# Patient Record
Sex: Female | Born: 1965
Health system: Southern US, Community
[De-identification: ages and names within clinical notes are randomized; demographics above are authoritative.]

## PROBLEM LIST (undated history)

## (undated) DIAGNOSIS — K219 Gastro-esophageal reflux disease without esophagitis: Secondary | ICD-10-CM

## (undated) DIAGNOSIS — D649 Anemia, unspecified: Secondary | ICD-10-CM

## (undated) DIAGNOSIS — M5126 Other intervertebral disc displacement, lumbar region: Secondary | ICD-10-CM

## (undated) DIAGNOSIS — E890 Postprocedural hypothyroidism: Secondary | ICD-10-CM

## (undated) DIAGNOSIS — K589 Irritable bowel syndrome without diarrhea: Secondary | ICD-10-CM

## (undated) DIAGNOSIS — L719 Rosacea, unspecified: Secondary | ICD-10-CM

## (undated) HISTORY — DX: Anemia, unspecified: D64.9

## (undated) HISTORY — PX: TONSILLECTOMY: SUR1361

## (undated) HISTORY — DX: Gastro-esophageal reflux disease without esophagitis: K21.9

## (undated) HISTORY — DX: Postprocedural hypothyroidism: E89.0

## (undated) HISTORY — DX: Irritable bowel syndrome, unspecified: K58.9

## (undated) HISTORY — DX: Rosacea, unspecified: L71.9

---

## 1988-01-14 LAB — HM COLONOSCOPY

## 1996-01-14 HISTORY — PX: TUBAL LIGATION: SHX77

## 2000-11-23 ENCOUNTER — Encounter: Admission: RE | Admit: 2000-11-23 | Discharge: 2000-11-23 | Payer: Self-pay | Admitting: Sports Medicine

## 2006-04-30 ENCOUNTER — Ambulatory Visit (HOSPITAL_COMMUNITY): Admission: RE | Admit: 2006-04-30 | Discharge: 2006-04-30 | Payer: Self-pay | Admitting: Orthopedic Surgery

## 2008-04-13 LAB — CONVERTED CEMR LAB: Pap Smear: NEGATIVE

## 2009-05-01 ENCOUNTER — Ambulatory Visit: Payer: Self-pay | Admitting: Internal Medicine

## 2009-05-01 DIAGNOSIS — M545 Low back pain, unspecified: Secondary | ICD-10-CM | POA: Insufficient documentation

## 2009-05-01 DIAGNOSIS — L719 Rosacea, unspecified: Secondary | ICD-10-CM | POA: Insufficient documentation

## 2009-05-01 DIAGNOSIS — B351 Tinea unguium: Secondary | ICD-10-CM | POA: Insufficient documentation

## 2009-05-01 DIAGNOSIS — K219 Gastro-esophageal reflux disease without esophagitis: Secondary | ICD-10-CM | POA: Insufficient documentation

## 2009-05-01 DIAGNOSIS — H9190 Unspecified hearing loss, unspecified ear: Secondary | ICD-10-CM | POA: Insufficient documentation

## 2009-05-01 DIAGNOSIS — Z9189 Other specified personal risk factors, not elsewhere classified: Secondary | ICD-10-CM | POA: Insufficient documentation

## 2009-05-01 DIAGNOSIS — D649 Anemia, unspecified: Secondary | ICD-10-CM | POA: Insufficient documentation

## 2009-05-03 ENCOUNTER — Telehealth (INDEPENDENT_AMBULATORY_CARE_PROVIDER_SITE_OTHER): Payer: Self-pay | Admitting: *Deleted

## 2009-05-03 ENCOUNTER — Ambulatory Visit: Payer: Self-pay | Admitting: Internal Medicine

## 2009-05-03 LAB — CONVERTED CEMR LAB
ALT: 27 U/L
AST: 27 U/L
Albumin: 3.9 g/dL
Alkaline Phosphatase: 61 U/L
BUN: 9 mg/dL
Basophils Absolute: 0 K/uL
Basophils Relative: 0.3 %
Bilirubin Urine: NEGATIVE
Bilirubin, Direct: 0.1 mg/dL
CO2: 29 meq/L
Calcium: 9 mg/dL
Chloride: 105 meq/L
Cholesterol: 161 mg/dL
Creatinine, Ser: 0.6 mg/dL
Eosinophils Absolute: 0.2 K/uL
Eosinophils Relative: 4.9 %
GFR calc non Af Amer: 115.2 mL/min
Glucose, Bld: 88 mg/dL
HCT: 38.9 %
HDL: 55 mg/dL
Hemoglobin, Urine: NEGATIVE
Hemoglobin: 13.2 g/dL
Ketones, ur: NEGATIVE mg/dL
LDL Cholesterol: 95 mg/dL
Leukocytes, UA: NEGATIVE
Lymphocytes Relative: 33.2 %
Lymphs Abs: 1.6 K/uL
MCHC: 34 g/dL
MCV: 92.5 fL
Monocytes Absolute: 0.3 K/uL
Monocytes Relative: 6.6 %
Neutro Abs: 2.6 K/uL
Neutrophils Relative %: 55 %
Nitrite: NEGATIVE
Platelets: 325 K/uL
Potassium: 4.3 meq/L
RBC: 4.2 M/uL
RDW: 13.9 %
Sodium: 140 meq/L
Specific Gravity, Urine: 1.02
TSH: 0.11 u[IU]/mL — ABNORMAL LOW
Total Bilirubin: 0.5 mg/dL
Total CHOL/HDL Ratio: 3
Total Protein, Urine: NEGATIVE mg/dL
Total Protein: 6.8 g/dL
Triglycerides: 57 mg/dL
Urine Glucose: NEGATIVE mg/dL
Urobilinogen, UA: 0.2
VLDL: 11.4 mg/dL
WBC: 4.7 10*3/microliter
pH: 7

## 2009-05-08 ENCOUNTER — Ambulatory Visit: Payer: Self-pay | Admitting: Endocrinology

## 2009-06-01 ENCOUNTER — Ambulatory Visit: Payer: Self-pay | Admitting: Endocrinology

## 2009-06-19 ENCOUNTER — Encounter (HOSPITAL_COMMUNITY): Admission: RE | Admit: 2009-06-19 | Discharge: 2009-09-17 | Payer: Self-pay | Admitting: Endocrinology

## 2009-06-22 ENCOUNTER — Ambulatory Visit: Payer: Self-pay | Admitting: Endocrinology

## 2009-06-24 LAB — CONVERTED CEMR LAB: TSH: 0.13 microintl units/mL — ABNORMAL LOW (ref 0.35–5.50)

## 2009-06-25 ENCOUNTER — Telehealth: Payer: Self-pay | Admitting: Endocrinology

## 2009-06-29 ENCOUNTER — Ambulatory Visit: Payer: Self-pay | Admitting: Endocrinology

## 2009-07-13 ENCOUNTER — Ambulatory Visit (HOSPITAL_COMMUNITY): Admission: RE | Admit: 2009-07-13 | Discharge: 2009-07-13 | Payer: Self-pay | Admitting: Endocrinology

## 2009-07-13 DIAGNOSIS — E890 Postprocedural hypothyroidism: Secondary | ICD-10-CM

## 2009-07-13 HISTORY — DX: Postprocedural hypothyroidism: E89.0

## 2009-09-06 ENCOUNTER — Ambulatory Visit: Payer: Self-pay | Admitting: Endocrinology

## 2009-09-06 DIAGNOSIS — E890 Postprocedural hypothyroidism: Secondary | ICD-10-CM | POA: Insufficient documentation

## 2009-09-06 LAB — CONVERTED CEMR LAB
Free T4: 0.41 ng/dL — ABNORMAL LOW (ref 0.60–1.60)
TSH: 37.69 microintl units/mL — ABNORMAL HIGH (ref 0.35–5.50)

## 2009-10-24 ENCOUNTER — Ambulatory Visit: Payer: Self-pay | Admitting: Endocrinology

## 2009-10-24 LAB — CONVERTED CEMR LAB: TSH: 4.21 microintl units/mL (ref 0.35–5.50)

## 2010-01-17 ENCOUNTER — Other Ambulatory Visit: Payer: Self-pay | Admitting: Endocrinology

## 2010-01-17 ENCOUNTER — Ambulatory Visit
Admission: RE | Admit: 2010-01-17 | Discharge: 2010-01-17 | Payer: Self-pay | Source: Home / Self Care | Attending: Endocrinology | Admitting: Endocrinology

## 2010-01-17 DIAGNOSIS — I959 Hypotension, unspecified: Secondary | ICD-10-CM | POA: Insufficient documentation

## 2010-01-17 LAB — CORTISOL: Cortisol, Plasma: 9 ug/dL

## 2010-01-17 LAB — TSH: TSH: 1.25 u[IU]/mL (ref 0.35–5.50)

## 2010-01-21 ENCOUNTER — Telehealth: Payer: Self-pay | Admitting: Endocrinology

## 2010-02-03 ENCOUNTER — Encounter: Payer: Self-pay | Admitting: Orthopedic Surgery

## 2010-02-12 NOTE — Assessment & Plan Note (Signed)
Summary: NEW [employer] PT--#--PKG/OFF--STC   Vital Signs:  Patient profile:   45 year old female Height:      67 inches (170.18 cm) Weight:      155.4 pounds (70.64 kg) BMI:     24.43 O2 Sat:      97 % on Room air Temp:     97.9 degrees F (36.61 degrees C) oral Pulse rate:   76 / minute BP sitting:   100 / 68  (left arm) Cuff size:   regular  Vitals Entered By: Orlan Leavens (May 01, 2009 3:16 PM)  O2 Flow:  Room air CC: New patient/CPX Is Patient Diabetic? No Pain Assessment Patient in pain? no        Primary Care Provider:  Newt Lukes MD  CC:  New patient/CPX.  History of Present Illness: new pt to me and our practice, here to est care - patient is here today for annual physical. Patient feels well -  she is not fasting but will return in AM for labs has annual PAP with gyn, due for mammo  wants to review several concerns 1) redness of cheeks - no itch or burn, no recent sunexposure - onset>6 mos ago -  symptoms worse in cold weather and after alcohol or spicy food tried changing face soap - no better - no other skin areas affected  2) chronic diarrhea - present "all my life"  had colo 1990 in [place] (home country) for same & "nothing found" describes as soft-liquid - has 3-6 BM every day - rare episode of constipation "when i am stressed or traveling" - no fever, no weight loss, no abd pain, no BRBPR or melena ?remote hx Giardia  3) concern about toenail fungus -  mostly on on right 3/4 toes present >12 mos also trauma to left great toe in past 77mo -  Preventive Screening-Counseling & Management  Alcohol-Tobacco     Alcohol drinks/day: <1     Alcohol Counseling: not indicated; use of alcohol is not excessive or problematic     Smoking Status: quit     Year Quit: 2005     Tobacco Counseling: not to resume use of tobacco products  Caffeine-Diet-Exercise     Diet Counseling: not indicated; diet is assessed to be healthy     Nutrition  Referrals: no     Does Patient Exercise: no     Exercise Counseling: to improve exercise regimen     Depression Counseling: not indicated; screening negative for depression  Safety-Violence-Falls     Seat Belt Use: yes     Helmet Use: n/a     Firearms in the Home: no firearms in the home     Smoke Detectors: yes     Violence in the Home: no risk noted     Fall Risk Counseling: not indicated; no significant falls noted  Clinical Review Panels:  Prevention   Last Mammogram:  No specific mammographic evidence of malignancy.   (04/13/2008)   Last Pap Smear:  Interpretation/Result:Negative for intraepithelial Lesion or Malignancy.    (04/13/2008)   Last Colonoscopy:  Pt states was done back in her home country due to ongoing diarrhea results came back normal (01/14/1988)  Immunizations   Last Tetanus Booster:  Historical (01/14/2004)   Current Medications (verified): 1)  None  Allergies (verified): 1)  ! Penicillin  Past History:  Past Medical History: Anemia-NOS GERD ?IBS LBP, right herniated disc L3-4, 4-5 12/2005 MRI  MD rooster[facility]  Past Surgical History: Tonsillectomy (1976) Tubal ligation (1998)  Family History: Family History Breast cancer 1st degree relative <50 (aunt, cousin) Family History of Colon CA 1st degree relative <60 (uncle on father side) Family History High cholesterol (dad) Emotional illness (mother) Dementia (grandmother) cancer of tongue (other relative)  Social History: Former Smoker married, lives with spouse since 2010 2 grown kids in college moved to [place] in 2009 from [place] to learn [place] -  works as Airline pilot and going to school at [employer] Smoking Status:  quit Does Patient Exercise:  no Risk analyst Use:  yes  Review of Systems       see HPI above. I have reviewed all other systems and they were negative.   Physical Exam  General:  alert, well-developed, well-nourished,  and cooperative to examination.  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  conjunctiva and lids normal.    Ears:  chronic decreased hearing L>>R (since age 68y) - lost hearing aide -normal pinnae bilaterally, without erythema, swelling, or tenderness to palpation. TMs clear, without effusion, or cerumen impaction Nose:  External nasal examination shows no deformity or inflammation. Nasal mucosa are pink and moist without lesions or exudates. Mouth:  teeth and gums in good repair; mucous membranes moist, without lesions or ulcers. oropharynx clear without exudate, no erythema.  Neck:  supple, full ROM, no masses, no thyromegaly; no thyroid nodules or tenderness. no JVD or carotid bruits.   Lungs:  normal respiratory effort, no intercostal retractions or use of accessory muscles; normal breath sounds bilaterally - no crackles and no wheezes.    Heart:  normal rate, regular rhythm, no murmur, and no rub. BLE without edema. normal DP pulses and normal cap refill in all 4 extremities    Abdomen:  soft, non-tender, normal bowel sounds, no distention; no masses and no appreciable hepatomegaly or splenomegaly.   Genitalia:  defer to gyn Msk:  No deformity or scoliosis noted of thoracic or lumbar spine.  back: full range of motion of lumbar spine. Nontender to palpation. Negative straight leg raise. Deep tendon reflexes symmetrically intact at Achilles and patella, negative clonus. Sensation intact throughout all dermatomes in bilateral lower extremities. Full strength to manual muscle testing in all major muscule groups including EHL, anterior tibialis, gastrocnemius, quadriceps, and iliopsoas. Able to heel and toe walk without difficulty and ambulates with a normal gait.  Neurologic:  alert & oriented X3 and cranial nerves II-XII symetrically intact.  strength  normal in all extremities, sensation intact to light touch, and gait normal. speech fluent without dysarthria or aphasia; follows commands with good comprehension.  Skin:  onchomycosis right 3 & 4 toenails, separation of great toenail on L due to prior trauma; +facial rosecea R>L face over cheeks Psych:  Oriented X3, memory intact for recent and remote, normally interactive, good eye contact, not anxious appearing, not depressed appearing, and not agitated.      Impression & Recommendations:  Problem # 1:  PREVENTIVE HEALTH CARE (ICD-V70.0) Patient has been counseled on age-appropriate routine health concerns for screening and prevention. These are reviewed and up-to-date. Immunizations are up-to-date or declined. Labs ordered for fasting in AM and will be reviewed; ECG declines; PAP per gyn  Problem # 2:  ONYCHOMYCOSIS, BILATERAL (ICD-110.1)  Her updated medication list for this problem includes:  Penlac 8 % Soln (Ciclopirox) .[name]... Apply to nail(s) at bedtime  Discussed nail care and medication treatment options.   Orders: Prescription Created Electronically [phone])  Problem # 3:  ROSACEA (ICD-695.3)  metrogel - erx done  Orders: Prescription Created Electronically [id number])  Problem # 4:  DIARRHEA (ICD-787.91)  ?IBS by hx vs other -  no screening labs done yet to look for anemia but ?malabs or celiac sprue refer to GI for new eval as not looked over since 1990 (>19y) no concerning symptoms on hx such as fever, blood, pain or weight loss Orders: Gastroenterology Referral (GI)  Discussed symptom control and diet. Call if worsening of symptoms priot to GI eval  Problem # 5:  LOW BACK PAIN, CHRONIC (ICD-724.2) related to ruptured disc 12/207 - copy of spanish MRI report reviewed today - right side L3-4 and 4-5 affected - s/p steroid injection prev at ortho/pain mgmt - pt states symptoms bearable at this time but will let us knw if this changes  Problem # 6:  HEARING LOSS  (ICD-389.9)  chronic issue L>R - refer to audiology as needs new hearing aid and new eval  Orders: Audiology (Audio)  Complete Medication List: 1)  Penlac 8 % Soln (Ciclopirox) .... Apply to nail(s) at bedtime 2)  Metrogel 1 % Gel (Metronidazole) .... Apply to affected face skin once daily 3)  Prevacid 24hr 15 Mg Cpdr (Lansoprazole) .[name].. 1 by mouth once daily as needed for heartburn symptoms  Patient Instructions: 1)  it was good to see you today. 2)  for your toenail fungus, use PenLac nail polish - 3)  for your rosecea, use MetroGel 4)  your prescriptions have been electronically submitted to your pharmacy. Please take as directed. Contact our office if you believe you're having problems with the medication(s).  5)  return to this office for labs only one morning this week before you have anything to eat (fasting) - physical bloodwork/labs ordered today - your results will be posted on the phone tree for review in 48-72 hours from the time of test completion; call [phone] and enter your 9 digit MRN (listed above on this page, just below your name); if any changes need to be made or there are abnormal results, you will be contacted directly.  6)  Please schedule a follow-up appointment annually for medical physical and labs, call sooner if problems.  7)  we'll make referral to [facility] for your diarrhea. Also for hearing tests at [facility]. Our office will contact you regarding these appointments once made.  8)  Schedule your mammogram. 9)  You should have regular Pap Smear to prevent cervical cancer. Let us know if you need a gynecology referral for this Prescriptions: METROGEL 1 % GEL (METRONIDAZOLE) apply to affected face skin once daily  #1 x 5   Entered and Authorized by:   Newt Lukes MD   Signed by:   Newt Lukes MD on 05/01/2009   Method used:   Electronically to        [facility] (retail)       [address] [place], [place]  [address]       Ph: [phone]       Fax: [phone]   RxID:   [id number] PENLAC 8 % SOLN (CICLOPIROX) apply to nail(s) at bedtime  #1 x 6   Entered and Authorized by:  Newt Lukes MD   Signed by:   Newt Lukes MD on 05/01/2009   Method used:   Electronically to        [facility] (retail)       [address] [place], [place]  [address]       Ph: [phone]       Fax: [phone]   RxID:   [id number]    Mammogram  Procedure date:  04/13/2008  Findings:      No specific mammographic evidence of malignancy.    Pap Smear  Procedure date:  04/13/2008  Findings:      Interpretation/Result:Negative for intraepithelial Lesion or Malignancy.     Colonoscopy  Procedure date:  01/14/1988  Findings:      Pt states was done back in her home country due to ongoing diarrhea results came back normal    Immunization History:  Tetanus/Td Immunization History:    Tetanus/Td:  historical (01/14/2004)

## 2010-02-12 NOTE — Assessment & Plan Note (Signed)
Summary: f/u appt/cd   Vital Signs:  Patient profile:   45 year old female Height:      67 inches (170.18 cm) Weight:      163.25 pounds (74.20 kg) BMI:     25.66 O2 Sat:      98 % on Room air Temp:     97.9 degrees F (36.61 degrees C) oral Pulse rate:   67 / minute BP sitting:   92 / 62  (left arm) Cuff size:   large  Vitals Entered By: Brenton Grills MA (September 06, 2009 10:45 AM)  O2 Flow:  Room air CC: F/U appt/refill on Pelnac/aj   Primary Provider:  Newt Lukes MD  CC:  F/U appt/refill on Pelnac/aj.  History of Present Illness: pt is now 8 weeks s/p i-131 rx for hyperthyroidism.  since then, she has weight gain and cold intolerance.    Current Medications (verified): 1)  Penlac 8 % Soln (Ciclopirox) .... Apply To Nail(S) At Bedtime 2)  Prevacid 24hr 15 Mg Cpdr (Lansoprazole) .[name].. 1 By Mouth Once Daily As Needed For Heartburn Symptoms 3)  Metronidazole 0.75 % Crea (Metronidazole) .... Apply To Face  Allergies (verified): 1)  ! Penicillin  Past History:  Past Medical History: Last updated: 05/01/2009 Anemia-NOS GERD ?IBS LBP, right herniated disc L3-4, 4-5 12/2005 MRI  MD rooster: [facility]  Review of Systems       diarrhea and insomnia have resolved  Physical Exam  General:  normal appearance.   Neck:  thyroid is non-palpable Additional Exam:  FastTSH              [H]  37.69 uIU/mL                0.35-5.50 Free T4              [L]  0.41 ng/dL      Impression & Recommendations:  Problem # 1:  OTHER POSTABLATIVE HYPOTHYROIDISM (ICD-244.1) needs increased rx  Other Orders: TLB-TSH (Thyroid Stimulating Hormone) (84443-TSH) TLB-T4 (Thyrox), Free [phone]) Est. Patient Level III (19147)  Patient Instructions: 1)  blood tests are being ordered for you today.  please call [phone] to hear your test results. 2)  here are some samples of "synthroid" 112 micrograms/day.  on the phone-message, i'll advise you  if you need to take. 3)  Please schedule a follow-up appointment in 1 month. 4)  (update: i left message on phone-tree:  start the synthroid as we discussed).

## 2010-02-12 NOTE — Progress Notes (Signed)
Summary: I-131?  Phone Note Call from Patient Call back at Work Phone [phone]   Caller: Patient Summary of Call: pt called requesting to know if she decides to take I-131 therapy, how much time does MD think she will need to take off work total? Initial call taken by: Margaret Pyle, CMA,  June 25, 2009 10:08 AM  Follow-up for Phone Call        pt advised 3-5 days. Pt had several follow up questions that I was unable to answer. I advised pt to sch f/u appt with SAE to discuss pros and cons of procedure. Pt transferred to sch Follow-up by: Margaret Pyle, CMA,  June 28, 2009 11:24 AM

## 2010-02-12 NOTE — Assessment & Plan Note (Signed)
Summary: new endo/leschber/thyroid/cd   Vital Signs:  Patient profile:   45 year old female Height:      67 inches (170.18 cm) Weight:      155.25 pounds (70.57 kg) O2 Sat:      98 % on Room air Temp:     97.0 degrees F (36.11 degrees C) oral Pulse rate:   80 / minute BP sitting:   108 / 68  (left arm) Cuff size:   regular  Vitals Entered By: Josph Macho RMA (May 08, 2009 3:40 PM)  O2 Flow:  Room air CC: New endo: Thyroid/ CF   Primary Provider:  Newt Lukes MD  CC:  New endo: Thyroid/ CF.  History of Present Illness: pt states of 20 years of moderate bowel frequency and palpitations in the chest, and associated hair loss, dry skin, anxiety, and depression.  she is unaware of having had any previous thyroid problem.  Current Medications (verified): 1)  Penlac 8 % Soln (Ciclopirox) .... Apply To Nail(S) At Bedtime 2)  Metrogel 1 % Gel (Metronidazole) .... Apply To Affected Face Skin Once Daily 3)  Prevacid 24hr 15 Mg Cpdr (Lansoprazole) .[name].. 1 By Mouth Once Daily As Needed For Heartburn Symptoms  Allergies (verified): 1)  ! Penicillin  Past History:  Past Medical History: Last updated: 05/01/2009 Anemia-NOS GERD ?IBS LBP, right herniated disc L3-4, 4-5 12/2005 MRI  MD rooster: [facility]  Family History: Reviewed history from 05/01/2009 and no changes required. Family History Breast cancer 1st degree relative <50 (aunt, cousin) Family History of Colon CA 1st degree relative <60 (uncle on father side) Family History High cholesterol (dad) Emotional illness (mother) Dementia (grandmother) cancer of tongue (other relative) no thyroid probs  Social History: Reviewed history from 05/01/2009 and no changes required. Former Smoker married, lives with spouse since 2010 2 grown kids in college moved to [place] in 2009 from [place] to learn [place] -  works as Airline pilot and going to school at [employer]  Review  of Systems       denies weight change, headache, hoarseness, visual loss, sob, excessive diaphoresis, numbness, tremor, and rhinorrhea she reports difficulty with concentration, muscle cramps, and polyuria.  she has decreased frequency of menses recently.    Physical Exam  General:  normal appearance.   Head:  head: no deformity eyes: no periorbital swelling, no proptosis external nose and ears are normal mouth: no lesion seen Neck:  thyroid is normal on the right, and slightly enlarged on the left.  no nodule Lungs:  Clear to auscultation bilaterally. Normal respiratory effort.  Heart:  Regular rate and rhythm without murmurs or gallops noted. Normal S1,S2.   Msk:  muscle bulk and strength are grossly normal.  no obvious joint swelling.  gait is normal and steady  Extremities:  no deformity no edema Neurologic:  cn 2-12 grossly intact.   readily moves all 4's.   sensation is intact to touch on the feet  Skin:  normal texture and temp.  no rash.  not diaphoretic  Cervical Nodes:  No significant adenopathy.  Psych:  Alert and cooperative; normal mood and affect; normal attention span and concentration.   Additional Exam:   FastTSH              [L]  0.11 uIU/mL    Impression & Recommendations:  Problem # 1:  THYROID STIMULATING HORMONE, ABNORMAL (ICD-246.9) based on the texture of her thyroid, is prob due to  grave's dz  Problem # 2:  palpitations prob due to #1  Problem # 3:  anxiety possibly due to #1  Medications Added to Medication List This Visit: 1)  Metronidazole 0.75 % Crea (Metronidazole) .... Apply to face  Other Orders: Consultation Level IV (81191)  Patient Instructions: 1)  go to lab in approx 2 weeks:  tsh and free t4 242.9. 2)  we discussed the causes, risks, and treatment options of hyperthyroidism. Prescriptions: METRONIDAZOLE 0.75 % CREA (METRONIDAZOLE) apply to face  #45 grams x 6   Entered and Authorized by:   Minus Breeding MD   Signed by:   Minus Breeding MD on 05/08/2009   Method used:   Electronically to        [phone]* (retail)       [address] [place], [place]  [address]       Ph: [phone]       Fax: [phone]   RxID:   [id number]

## 2010-02-12 NOTE — Assessment & Plan Note (Signed)
Summary: F/U APPT/#/CD   Vital Signs:  Patient profile:   45 year old female Height:      67 inches (170.18 cm) Weight:      159.38 pounds (72.45 kg) O2 Sat:      98 % on Room air Temp:     98.1 degrees F (36.72 degrees C) oral Pulse rate:   85 / minute BP sitting:   98 / 80  (left arm) Cuff size:   regular  Vitals Entered By: Josph Macho RMA (June 29, 2009 4:14 PM)  O2 Flow:  Room air CC: Follow-up visit/ CF Is Patient Diabetic? No   Primary Provider:  Newt Lukes MD  CC:  Follow-up visit/ CF.  History of Present Illness: pt has monthly but light menses.  pt states she feels well in general.   Current Medications (verified): 1)  Penlac 8 % Soln (Ciclopirox) .... Apply To Nail(S) At Bedtime 2)  Prevacid 24hr 15 Mg Cpdr (Lansoprazole) .[name].. 1 By Mouth Once Daily As Needed For Heartburn Symptoms 3)  Metronidazole 0.75 % Crea (Metronidazole) .... Apply To Face  Allergies (verified): 1)  ! Penicillin  Past History:  Past Medical History: Last updated: 05/01/2009 Anemia-NOS GERD ?IBS LBP, right herniated disc L3-4, 4-5 12/2005 MRI  MD rooster: [facility]  Review of Systems  The patient denies fever.    Physical Exam  General:  normal appearance.   Neck:  thyroid is normal on the right, and slightly enlarged on the left.  no nodule   Impression & Recommendations:  Problem # 1:  HYPERTHYROIDISM (ICD-242.90) Assessment Improved we discussed rx options.  after discussion, pt chooses i-131 rx  Other Orders: Radiology Referral (Radiology) Est. Patient Level III ([address])  Patient Instructions: 1)  please do radioactive iodine therapy.  you will be called with a day and time for an appointment 2)  please return here 6-8 weeks later.

## 2010-02-12 NOTE — Progress Notes (Signed)
----   Converted from flag ---- ---- 05/03/2009 3:14 PM, Verdell Face wrote: appt made for 5/6 w/Dr Everardo All.  ---- 05/03/2009 2:07 PM, Dagoberto Reef wrote: Thanks  ---- 05/03/2009 1:47 PM, Newt Lukes MD wrote: The following orders have been entered for this patient and placed on Admin Hold:  Type:     Referral       Code:   Endocrine Description:   Endocrinology Referral Order Date:   05/03/2009   Authorized By:   Newt Lukes MD Order #:   [id number] Clinical Notes:   ellsion - abn TSH -eval and tx ------------------------------

## 2010-02-12 NOTE — Assessment & Plan Note (Signed)
Summary: F/U APPT/#/CD   Vital Signs:  Patient profile:   45 year old female Height:      67 inches (170.18 cm) Weight:      166.38 pounds (75.63 kg) BMI:     26.15 O2 Sat:      99 % on Room air Temp:     98.0 degrees F (36.67 degrees C) oral Pulse rate:   86 / minute BP sitting:   120 / 78  (left arm) Cuff size:   large  Vitals Entered By: Brenton Grills MA (October 24, 2009 8:53 AM)  O2 Flow:  Room air CC: Follow-up visit/aj Is Patient Diabetic? No   Primary Provider:  Newt Lukes MD  CC:  Follow-up visit/aj.  History of Present Illness: pt is now 3 1/2 months s/p i-131 rx for hyperthyroidism.  she feels better in general on the synthroid.    Current Medications (verified): 1)  Penlac 8 % Soln (Ciclopirox) .... Apply To Nail(S) At Bedtime 2)  Prevacid 24hr 15 Mg Cpdr (Lansoprazole) .[name].. 1 By Mouth Once Daily As Needed For Heartburn Symptoms 3)  Metronidazole 0.75 % Crea (Metronidazole) .... Apply To Face  Allergies (verified): 1)  ! Penicillin  Past History:  Past Medical History: Last updated: 05/01/2009 Anemia-NOS GERD ?IBS LBP, right herniated disc L3-4, 4-5 12/2005 MRI  MD rooster: [facility]  Review of Systems  The patient denies weight loss and weight gain.    Physical Exam  General:  normal appearance.   Neck:  thyroid is non-palpable. Additional Exam:   FastTSH                   4.21 uIU/mL      Impression & Recommendations:  Problem # 1:  OTHER POSTABLATIVE HYPOTHYROIDISM (ICD-244.1) tsh is normal, but increased need for synthroid is anticipated at this point after i-131 rx  Other Orders: TLB-TSH (Thyroid Stimulating Hormone) (84443-TSH) Est. Patient Level III ([address])  Patient Instructions: 1)  blood tests are being ordered for you today.  please call [phone] to hear your test results. 2)  Please schedule a follow-up appointment in 1 month. 3)  here are some samples of "synthroid" 150  micrograms. 4)  the messsage will tell you if this is the correct new strength. 5)  (update: i left message on phone-tree:  take the synthroid 150/day.  ret as scheduled).

## 2010-02-14 NOTE — Assessment & Plan Note (Signed)
Summary: rx refill-lb   Vital Signs:  Patient profile:   45 year old female Height:      67 inches (170.18 cm) Weight:      170.38 pounds (77.45 kg) BMI:     26.78 O2 Sat:      98 % on Room air Temp:     98.9 degrees F (37.17 degrees C) oral Pulse rate:   96 / minute Pulse rhythm:   regular BP sitting:   90 / 62  (left arm) Cuff size:   large  Vitals Entered By: Brenton Grills CMA (AAMA) (January 17, 2010 8:04 AM)  O2 Flow:  Room air CC: Follow-up visit/aj Is Patient Diabetic? No   Primary Provider:  Newt Lukes MD  CC:  Follow-up visit/aj.  History of Present Illness: pt is now 6 months s/p i-131 rx for hyperthyroidism.  she feels better in general on the synthroid 150/day.  in particular, she reports less hair loss.     Current Medications (verified): 1)  Penlac 8 % Soln (Ciclopirox) .... Apply To Nail(S) At Bedtime 2)  Prevacid 24hr 15 Mg Cpdr (Lansoprazole) .[name].. 1 By Mouth Once Daily As Needed For Heartburn Symptoms 3)  Metronidazole 0.75 % Crea (Metronidazole) .... Apply To Face  Allergies (verified): 1)  ! Penicillin  Past History:  Past Medical History: Last updated: 05/01/2009 Anemia-NOS GERD ?IBS LBP, right herniated disc L3-4, 4-5 12/2005 MRI  MD rooster: g[facility] - [facility]  Review of Systems  The patient denies syncope.         she denies dizziness  Physical Exam  General:  normal appearance.   Neck:  thyroid is non-palpable. Additional Exam:  FastTSH                   1.25 uIU/mL                 0.35-5.50 Cortisol                  9.0 ug/dL   Impression & Recommendations:  Problem # 1:  OTHER POSTABLATIVE HYPOTHYROIDISM (ICD-244.1) well-replaced  Problem # 2:  HYPOTENSION (ICD-458.9) asymptomatic this random cortisol does not determine intact adrenal function pt declines acth stim test  Medications Added to Medication List This Visit: 1)  Levothyroxine Sodium 150 Mcg Tabs (Levothyroxine sodium)  .[name].. 1 tab once daily  Other Orders: TLB-TSH (Thyroid Stimulating Hormone) (84443-TSH) TLB-Cortisol (82533-CORT) Est. Patient Level III (04540)  Patient Instructions: 1)  blood tests are being ordered for you today.  please call [phone] to hear your test results. 2)  pending the test results, please continue the same medications for now 3)  in 3 months, go to lab for tsh 244.1 4)  return here as needed. 5)  (update: i left message on phone-tree:  call if you want acth test)   Orders Added: 1)  TLB-TSH (Thyroid Stimulating Hormone) [84443-TSH] 2)  TLB-Cortisol [82533-CORT] 3)  Est. Patient Level III [78295]

## 2010-02-14 NOTE — Progress Notes (Signed)
Summary: Rx refill req  Phone Note Refill Request Message from:  Patient on January 21, 2010 11:25 AM  Refills Requested: Medication #1:  LEVOTHYROXINE SODIUM 150 MCG TABS 1 tab once daily.   Dosage confirmed as above?Dosage Confirmed  Method Requested: Electronic Initial call taken by: Margaret Pyle, CMA,  January 21, 2010 11:25 AM    Prescriptions: LEVOTHYROXINE SODIUM 150 MCG TABS (LEVOTHYROXINE SODIUM) 1 tab once daily  #90 x 1   Entered by:   Margaret Pyle, CMA   Authorized by:   Minus Breeding MD   Signed by:   Margaret Pyle, CMA on 01/21/2010   Method used:   Electronically to        [facility] [id number] (retail)       [address] [place], [place]  [address]       Ph: [phone]       Fax: [phone]   RxID:   [id number]

## 2010-04-22 ENCOUNTER — Other Ambulatory Visit: Payer: Self-pay | Admitting: Internal Medicine

## 2010-04-22 DIAGNOSIS — E890 Postprocedural hypothyroidism: Secondary | ICD-10-CM

## 2010-04-23 ENCOUNTER — Other Ambulatory Visit: Payer: Self-pay

## 2010-06-04 ENCOUNTER — Encounter: Payer: Self-pay | Admitting: Internal Medicine

## 2010-06-05 ENCOUNTER — Encounter: Payer: Self-pay | Admitting: Internal Medicine

## 2010-06-05 ENCOUNTER — Other Ambulatory Visit (INDEPENDENT_AMBULATORY_CARE_PROVIDER_SITE_OTHER): Payer: BC Managed Care – PPO

## 2010-06-05 ENCOUNTER — Ambulatory Visit (INDEPENDENT_AMBULATORY_CARE_PROVIDER_SITE_OTHER): Payer: BC Managed Care – PPO | Admitting: Internal Medicine

## 2010-06-05 VITALS — BP 92/60 | HR 98 | Temp 98.2°F | Ht 67.0 in | Wt 165.8 lb

## 2010-06-05 DIAGNOSIS — Z0000 Encounter for general adult medical examination without abnormal findings: Secondary | ICD-10-CM

## 2010-06-05 DIAGNOSIS — E890 Postprocedural hypothyroidism: Secondary | ICD-10-CM

## 2010-06-05 LAB — HEPATIC FUNCTION PANEL
Albumin: 3.8 g/dL (ref 3.5–5.2)
Bilirubin, Direct: 0.1 mg/dL (ref 0.0–0.3)
Total Protein: 6.6 g/dL (ref 6.0–8.3)

## 2010-06-05 LAB — URINALYSIS
Ketones, ur: NEGATIVE
Leukocytes, UA: NEGATIVE
Specific Gravity, Urine: 1.015 (ref 1.000–1.030)
pH: 8 (ref 5.0–8.0)

## 2010-06-05 LAB — CBC WITH DIFFERENTIAL/PLATELET
Basophils Absolute: 0 10*3/uL (ref 0.0–0.1)
Basophils Relative: 0.5 % (ref 0.0–3.0)
Eosinophils Relative: 3.1 % (ref 0.0–5.0)
Hemoglobin: 12.5 g/dL (ref 12.0–15.0)
Lymphocytes Relative: 30.1 % (ref 12.0–46.0)
Monocytes Relative: 6.8 % (ref 3.0–12.0)
Neutro Abs: 3.3 10*3/uL (ref 1.4–7.7)
RBC: 4.15 Mil/uL (ref 3.87–5.11)
WBC: 5.5 10*3/uL (ref 4.5–10.5)

## 2010-06-05 LAB — BASIC METABOLIC PANEL
BUN: 13 mg/dL (ref 6–23)
Chloride: 107 mEq/L (ref 96–112)
Potassium: 4.9 mEq/L (ref 3.5–5.1)

## 2010-06-05 LAB — LIPID PANEL
Cholesterol: 152 mg/dL (ref 0–200)
LDL Cholesterol: 87 mg/dL (ref 0–99)
Triglycerides: 61 mg/dL (ref 0.0–149.0)
VLDL: 12.2 mg/dL (ref 0.0–40.0)

## 2010-06-05 LAB — TSH: TSH: 0.04 u[IU]/mL — ABNORMAL LOW (ref 0.35–5.50)

## 2010-06-05 MED ORDER — LEVOTHYROXINE SODIUM 137 MCG PO TABS: 137.0000 ug | ORAL_TABLET | Freq: Every day | ORAL | Status: DC

## 2010-06-05 NOTE — Progress Notes (Signed)
Subjective:    Patient ID: Kathryn Gregory, female    DOB: 06/04/1965, 45 y.o.   MRN: [id number]  HPI  patient is here today for annual physical. Patient feels well and has no complaints.  Past Medical History  Diagnosis Date  . Rosacea   . IBS (irritable bowel syndrome)   . ANEMIA-NOS   . GERD   . Other postablative hypothyroidism     I-131ablation 07/2009 for hyperthroid   Family History  Problem Relation Age of Onset  . Mental illness Mother   . Breast cancer Maternal Aunt   . Colon cancer Paternal Uncle   . Dementia Maternal Grandmother   . Breast cancer Cousin   . Cancer Other     cancer of tongue   History  Substance Use Topics  . Smoking status: Former Games developer  . Smokeless tobacco: Not on file   Comment: Married, lives with spouse since 2010. 2 grown kids in [place] in 2009 feom [place] to learn english  . Alcohol Use: No     Review of Systems  Constitutional: Negative for fever.  Respiratory: Negative for cough and shortness of breath.   Cardiovascular: Negative for chest pain.  Gastrointestinal: Negative for abdominal pain.  Musculoskeletal: Negative for gait problem.  Skin: Negative for rash.  Neurological: Negative for dizziness.  No other specific complaints in a complete review of systems (except as listed in HPI above).     Objective:   Physical Exam BP 92/60  Pulse 98  Temp(Src) 98.2 F (36.8 C) (Oral)  Ht 5\' 7"  (1.702 m)  Wt 165 lb 12.8 oz (75.206 kg)  BMI 25.97 kg/m2  SpO2 98% Physical Exam  Constitutional: She is oriented to person, place, and time. She appears well-developed and well-nourished. No distress.  HENT: Head: Normocephalic and atraumatic. Ears; B TMs ok, no erythema or effusion; Nose: Nose normal.  Mouth/Throat: Oropharynx is clear and moist. No oropharyngeal exudate.  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 Pupils are equal, round, and reactive to light. No scleral  icterus.  Neck: Normal range of motion. Neck supple. No JVD present. No thyromegaly present.  Cardiovascular: Normal rate, regular rhythm and normal heart sounds.  No murmur heard. No BLE edema. Pulmonary/Chest: Effort normal and breath sounds normal. No respiratory distress. She has no wheezes.  Abdominal: Soft. Bowel sounds are normal. She exhibits no distension. There is no tenderness.  Musculoskeletal: Normal range of motion, no joint effusions. No gross deformities Neurological: She is alert and oriented to person, place, and time. No cranial nerve deficit. Coordination normal.  Skin: Skin is warm and dry. No rash noted. No erythema.  Psychiatric: She has a normal mood and affect. Her behavior is normal. Judgment and thought content normal.   Lab Results  Component Value Date   WBC 5.5 06/05/2010   HGB 12.5 06/05/2010   HCT 37.2 06/05/2010   PLT 306.0 06/05/2010   CHOL 152 06/05/2010   TRIG 61.0 06/05/2010   HDL 52.90 06/05/2010   ALT 19 06/05/2010   AST 24 06/05/2010   NA 140 06/05/2010   K 4.9 06/05/2010   CL 107 06/05/2010   CREATININE 0.7 06/05/2010   BUN 13 06/05/2010   CO2 28 06/05/2010   TSH 0.04* 06/05/2010          Assessment & Plan:  CPX - v70.0 - Patient has been counseled on age-appropriate routine health concerns for screening and prevention. These are reviewed and up-to-date.  Immunizations are up-to-date or declined. Labs and ECG reviewed: NSR, no ischemic change  Post ablation hypothyroid - appears overtx based on TSH - reduce dose sythroid to now - recheck TSH in 3 months, follow up endo if problems

## 2010-06-05 NOTE — Patient Instructions (Addendum)
It was good to see you today. Exam, labs and EKG look good (except thyroid) - keep up the good work -  Reduce dose thyroid medication today to daily - Your prescription(s) have been submitted to your pharmacy. Please take as directed and contact our office if you believe you are having problem(s) with the medication(s). Return in 3 months for lab only visit to recheck thyroid levels. Your results will be called to you after review (48-72hours after test completion). If any changes need to be made, you will be notified at that time. Schedule visit with gynecology and for mammogram as we discussed - do it this year! Please schedule followup in 6 months for thyroid check visit, call sooner if problems.

## 2010-06-05 NOTE — Assessment & Plan Note (Signed)
I-131 tx 07/2009 for hyperthyroid dz Now appears overtx based on TSH -  reduce dose sythroid to now - recheck TSH in 3 months, follow up endo if problems

## 2010-06-06 ENCOUNTER — Telehealth: Payer: Self-pay | Admitting: Endocrinology

## 2010-06-06 DIAGNOSIS — E890 Postprocedural hypothyroidism: Secondary | ICD-10-CM

## 2010-06-06 NOTE — Telephone Encounter (Signed)
please call patient: Dr Felicity Coyer has reduce the synthroid.  Go to lab in 1 month for repeat tsh.  i have ordered.

## 2010-06-06 NOTE — Telephone Encounter (Deleted)
please call patient: Reduce synthroid to 75 mcg/day.  Ret 6 weeks.  i sent rx.

## 2010-06-06 NOTE — Telephone Encounter (Signed)
Pt advised.

## 2010-07-11 ENCOUNTER — Other Ambulatory Visit (INDEPENDENT_AMBULATORY_CARE_PROVIDER_SITE_OTHER): Payer: BC Managed Care – PPO

## 2010-07-11 ENCOUNTER — Telehealth: Payer: Self-pay | Admitting: Endocrinology

## 2010-07-11 DIAGNOSIS — E890 Postprocedural hypothyroidism: Secondary | ICD-10-CM

## 2010-07-11 MED ORDER — LEVOTHYROXINE SODIUM 125 MCG PO TABS: 125.0000 ug | ORAL_TABLET | Freq: Every day | ORAL | Status: DC

## 2010-07-11 NOTE — Telephone Encounter (Signed)
i left message on phone tree Reduce synthroid to 125/d Go back to lab in 4-6 weeks for recheck

## 2010-08-16 ENCOUNTER — Other Ambulatory Visit (INDEPENDENT_AMBULATORY_CARE_PROVIDER_SITE_OTHER): Payer: BC Managed Care – PPO

## 2010-08-16 ENCOUNTER — Telehealth: Payer: Self-pay | Admitting: Endocrinology

## 2010-08-16 DIAGNOSIS — E890 Postprocedural hypothyroidism: Secondary | ICD-10-CM

## 2010-08-16 MED ORDER — LEVOTHYROXINE SODIUM 112 MCG PO TABS: 112.0000 ug | ORAL_TABLET | Freq: Every day | ORAL | Status: DC

## 2010-08-16 NOTE — Telephone Encounter (Signed)
i left message on phone tree Reduce synthroid to 112/d Recheck in 4-6 weeks

## 2010-09-24 ENCOUNTER — Other Ambulatory Visit (INDEPENDENT_AMBULATORY_CARE_PROVIDER_SITE_OTHER): Payer: BC Managed Care – PPO

## 2010-09-24 DIAGNOSIS — E890 Postprocedural hypothyroidism: Secondary | ICD-10-CM

## 2011-02-20 ENCOUNTER — Other Ambulatory Visit: Payer: Self-pay | Admitting: Endocrinology

## 2011-04-02 ENCOUNTER — Other Ambulatory Visit: Payer: Self-pay | Admitting: Endocrinology

## 2011-04-09 LAB — HM MAMMOGRAPHY: HM Mammogram: NEGATIVE

## 2011-05-29 ENCOUNTER — Telehealth: Payer: Self-pay | Admitting: *Deleted

## 2011-05-29 DIAGNOSIS — Z0000 Encounter for general adult medical examination without abnormal findings: Secondary | ICD-10-CM

## 2011-05-29 NOTE — Telephone Encounter (Signed)
Received staff msg pt made cpx 07/02/11 need labs entered in epic,,,, 05/29/11@1 :15pm/LMB

## 2011-05-29 NOTE — Telephone Encounter (Signed)
Message copied by Deatra James on Thu May 29, 2011  1:14 PM ------      Message from: COUSIN, SHARON T      Created: Thu May 29, 2011  1:09 PM      Regarding: PHY DATE:  07/02/11       THANKS

## 2011-07-01 ENCOUNTER — Other Ambulatory Visit (INDEPENDENT_AMBULATORY_CARE_PROVIDER_SITE_OTHER): Payer: BC Managed Care – PPO

## 2011-07-01 DIAGNOSIS — Z0000 Encounter for general adult medical examination without abnormal findings: Secondary | ICD-10-CM

## 2011-07-01 LAB — HEPATIC FUNCTION PANEL
ALT: 17 U/L (ref 0–35)
AST: 26 U/L (ref 0–37)
Albumin: 3.9 g/dL (ref 3.5–5.2)
Alkaline Phosphatase: 65 U/L (ref 39–117)
Total Protein: 6.9 g/dL (ref 6.0–8.3)

## 2011-07-01 LAB — URINALYSIS, ROUTINE W REFLEX MICROSCOPIC
Hgb urine dipstick: NEGATIVE
Nitrite: NEGATIVE
Specific Gravity, Urine: 1.015 (ref 1.000–1.030)
Total Protein, Urine: NEGATIVE
Urine Glucose: NEGATIVE
Urobilinogen, UA: 0.2 (ref 0.0–1.0)

## 2011-07-01 LAB — LIPID PANEL
Cholesterol: 142 mg/dL (ref 0–200)
HDL: 55.6 mg/dL (ref >39.00)
Total CHOL/HDL Ratio: 3
Triglycerides: 64 mg/dL (ref 0.0–149.0)

## 2011-07-01 LAB — BASIC METABOLIC PANEL
CO2: 27 mEq/L (ref 19–32)
GFR: 121.05 mL/min (ref >60.00)
Glucose, Bld: 86 mg/dL (ref 70–99)
Potassium: 4.2 mEq/L (ref 3.5–5.1)
Sodium: 140 mEq/L (ref 135–145)

## 2011-07-01 LAB — CBC WITH DIFFERENTIAL/PLATELET
Basophils Absolute: 0 10*3/uL (ref 0.0–0.1)
Eosinophils Relative: 3.7 % (ref 0.0–5.0)
Lymphocytes Relative: 28.8 % (ref 12.0–46.0)
Monocytes Relative: 5.9 % (ref 3.0–12.0)
Neutrophils Relative %: 61 % (ref 43.0–77.0)
Platelets: 266 10*3/uL (ref 150.0–400.0)
RDW: 14.4 % (ref 11.5–14.6)
WBC: 6.5 10*3/uL (ref 4.5–10.5)

## 2011-07-02 ENCOUNTER — Telehealth: Payer: Self-pay | Admitting: *Deleted

## 2011-07-02 ENCOUNTER — Other Ambulatory Visit: Payer: Self-pay | Admitting: Internal Medicine

## 2011-07-02 ENCOUNTER — Encounter: Payer: Self-pay | Admitting: Internal Medicine

## 2011-07-02 ENCOUNTER — Ambulatory Visit (INDEPENDENT_AMBULATORY_CARE_PROVIDER_SITE_OTHER): Payer: BC Managed Care – PPO | Admitting: Internal Medicine

## 2011-07-02 VITALS — BP 110/78 | HR 70 | Temp 97.8°F | Ht 66.5 in | Wt 153.8 lb

## 2011-07-02 DIAGNOSIS — Z0000 Encounter for general adult medical examination without abnormal findings: Secondary | ICD-10-CM

## 2011-07-02 DIAGNOSIS — E890 Postprocedural hypothyroidism: Secondary | ICD-10-CM

## 2011-07-02 DIAGNOSIS — L719 Rosacea, unspecified: Secondary | ICD-10-CM

## 2011-07-02 MED ORDER — CICLOPIROX 8 % EX SOLN: Freq: Every day | CUTANEOUS | Status: DC

## 2011-07-02 MED ORDER — LEVOTHYROXINE SODIUM 112 MCG PO TABS: 112.0000 ug | ORAL_TABLET | Freq: Every day | ORAL | Status: DC

## 2011-07-02 NOTE — Assessment & Plan Note (Signed)
No significant response to topical metronidazole Refer to derm as per pt request

## 2011-07-02 NOTE — Telephone Encounter (Signed)
Received staff msg made cpx for 07/02/12. Need labs entered in epic... 07/02/11@2 :54pm/LMB

## 2011-07-02 NOTE — Assessment & Plan Note (Signed)
I-131 tx 07/2009 for hyperthyroid dz The current medical regimen is effective;  continue present plan and medications.  follow up endo if problems Lab Results  Component Value Date   TSH 2.60 07/01/2011

## 2011-07-02 NOTE — Progress Notes (Signed)
Subjective:    Patient ID: Kathryn Gregory, female    DOB: 09-17-1965, 46 y.o.   MRN: [id number]  HPI  patient is here today for annual physical. Patient feels well and has no complaints.  Past Medical History  Diagnosis Date  . Rosacea   . IBS (irritable bowel syndrome)   . ANEMIA-NOS   . GERD   . Other postablative hypothyroidism 07/2009    I-131ablation 07/2009 for hyperthyroid   Family History  Problem Relation Age of Onset  . Mental illness Mother   . Breast cancer Maternal Aunt   . Colon cancer Paternal Uncle   . Dementia Maternal Grandmother   . Breast cancer Cousin   . Cancer Other     cancer of tongue   History  Substance Use Topics  . Smoking status: Former Games developer  . Smokeless tobacco: Not on file   Comment: Married, lives with spouse since 2010. 2 grown kids in [place] in 2009 feom [place] to learn english  . Alcohol Use: No     Review of Systems Constitutional: Negative for fever.  Respiratory: Negative for cough and shortness of breath.   Cardiovascular: Negative for chest pain or palpitations.  Gastrointestinal: Negative for abdominal pain.  Musculoskeletal: Negative for gait problem.  Skin: Negative for rash.  Neurological: Negative for dizziness.  No other specific complaints in a complete review of systems (except as listed in HPI above).     Objective:   Physical Exam  BP 110/78  Pulse 70  Temp 97.8 F (36.6 C) (Oral)  Ht 5' 6.5" (1.689 m)  Wt 153 lb 12.8 oz (69.763 kg)  BMI 24.45 kg/m2  SpO2 98% Wt Readings from Last 3 Encounters:  07/02/11 153 lb 12.8 oz (69.763 kg)  06/05/10 165 lb 12.8 oz (75.206 kg)  01/17/10 170 lb 6.1 oz (77.284 kg)   Constitutional: She appears well-developed and well-nourished. No distress.  HENT: Head: Normocephalic and atraumatic. Ears; B TMs ok, no erythema or effusion; Nose: Nose normal. Mouth/Throat: Oropharynx is clear and moist. No oropharyngeal exudate.  Chronic right lower jaw deformity from birth (forcep)  injury Eyes: Conjunctivae and EOM are normal. Pupils are equal, round, and reactive to light. No scleral icterus.  Neck: Normal range of motion. Neck supple. No JVD present. No thyromegaly present.  Cardiovascular: Normal rate, regular rhythm and normal heart sounds.  No murmur heard. No BLE edema. Pulmonary/Chest: Effort normal and breath sounds normal. No respiratory distress. She has no wheezes.  Abdominal: Soft. Bowel sounds are normal. She exhibits no distension. There is no tenderness.  Musculoskeletal: Normal range of motion, no joint effusions. No gross deformities Neurological: She is alert and oriented to person, place, and time. No cranial nerve deficit. Coordination normal.  Skin: facial rosacea - other skin is warm and dry. No rash noted. No erythema.  Psychiatric: She has a normal mood and affect. Her behavior is normal. Judgment and thought content normal.   Lab Results  Component Value Date   WBC 6.5 07/01/2011   HGB 11.9* 07/01/2011   HCT 36.5 07/01/2011   PLT 266.0 07/01/2011   CHOL 142 07/01/2011   TRIG 64.0 07/01/2011   HDL 55.60 07/01/2011   ALT 17 07/01/2011   AST 26 07/01/2011   NA 140 07/01/2011   K 4.2 07/01/2011   CL 107 07/01/2011   CREATININE 0.6 07/01/2011   BUN 11 07/01/2011   CO2 27 07/01/2011   TSH 2.60 07/01/2011   ECG: nsr @71  bpm, no ischemic changes  Assessment & Plan:  CPX - v70.0 - Patient has been counseled on age-appropriate routine health concerns for screening and prevention. These are reviewed and up-to-date. Immunizations are up-to-date or declined. Labs and ECG reviewed: NSR, no ischemic change

## 2011-07-02 NOTE — Telephone Encounter (Signed)
Message copied by Deatra James on Wed Jul 02, 2011  2:52 PM ------      Message from: COUSIN, SHARON T      Created: Wed Jul 02, 2011  2:38 PM      Regarding: PHY DATE   07/02/12       THANKS

## 2011-07-02 NOTE — Patient Instructions (Signed)
It was good to see you today. We have reviewed your prior records including labs and tests today Health Maintenance reviewed - all recommended immunizations and age-appropriate screenings are up-to-date.  Medications reviewed, no changes at this time. Refill on medication(s) as discussed today. we'll make referral to dermatologist for your skin. Our office will contact you regarding appointment(s) once made. Please schedule followup in 1 year for medical physical and labs, call sooner if problems.  Preventive Care for Adults, Female A healthy lifestyle and preventive care can promote health and wellness. Preventive health guidelines for women include the following key practices.  A routine yearly physical is a good way to check with your caregiver about your health and preventive screening. It is a chance to share any concerns and updates on your health, and to receive a thorough exam.   Visit your dentist for a routine exam and preventive care every 6 months. Brush your teeth twice a day and floss once a day. Good oral hygiene prevents tooth decay and gum disease.   The frequency of eye exams is based on your age, health, family medical history, use of contact lenses, and other factors. Follow your caregiver's recommendations for frequency of eye exams.   Eat a healthy diet. Foods like vegetables, fruits, whole grains, low-fat dairy products, and lean protein foods contain the nutrients you need without too many calories. Decrease your intake of foods high in solid fats, added sugars, and salt. Eat the right amount of calories for you. Get information about a proper diet from your caregiver, if necessary.   Regular physical exercise is one of the most important things you can do for your health. Most adults should get at least 150 minutes of moderate-intensity exercise (any activity that increases your heart rate and causes you to sweat) each week. In addition, most adults need  muscle-strengthening exercises on 2 or more days a week.   Maintain a healthy weight. The body mass index (BMI) is a screening tool to identify possible weight problems. It provides an estimate of body fat based on height and weight. Your caregiver can help determine your BMI, and can help you achieve or maintain a healthy weight. For adults 20 years and older:   A BMI below 18.5 is considered underweight.   A BMI of 18.5 to 24.9 is normal.   A BMI of 25 to 29.9 is considered overweight.   A BMI of 30 and above is considered obese.   Maintain normal blood lipids and cholesterol levels by exercising and minimizing your intake of saturated fat. Eat a balanced diet with plenty of fruit and vegetables. Blood tests for lipids and cholesterol should begin at age 78 and be repeated every 5 years. If your lipid or cholesterol levels are high, you are over 50, or you are at high risk for heart disease, you may need your cholesterol levels checked more frequently. Ongoing high lipid and cholesterol levels should be treated with medicines if diet and exercise are not effective.   If you smoke, find out from your caregiver how to quit. If you do not use tobacco, do not start.   If you are pregnant, do not drink alcohol. If you are breastfeeding, be very cautious about drinking alcohol. If you are not pregnant and choose to drink alcohol, do not exceed 1 drink per day. One drink is considered to be 12 ounces (355 mL) of beer, 5 ounces (148 mL) of wine, or 1.5 ounces (44 mL) of liquor.  Avoid use of street drugs. Do not share needles with anyone. Ask for help if you need support or instructions about stopping the use of drugs.   High blood pressure causes heart disease and increases the risk of stroke. Your blood pressure should be checked at least every 1 to 2 years. Ongoing high blood pressure should be treated with medicines if weight loss and exercise are not effective.   If you are 30 to 47 years old,  ask your caregiver if you should take aspirin to prevent strokes.   Diabetes screening involves taking a blood sample to check your fasting blood sugar level. This should be done once every 3 years, after age 33, if you are within normal weight and without risk factors for diabetes. Testing should be considered at a younger age or be carried out more frequently if you are overweight and have at least 1 risk factor for diabetes.   Breast cancer screening is essential preventive care for women. You should practice "breast self-awareness." This means understanding the normal appearance and feel of your breasts and may include breast self-examination. Any changes detected, no matter how small, should be reported to a caregiver. Women in their 25s and 30s should have a clinical breast exam (CBE) by a caregiver as part of a regular health exam every 1 to 3 years. After age 54, women should have a CBE every year. Starting at age 13, women should consider having a mammography (breast X-ray test) every year. Women who have a family history of breast cancer should talk to their caregiver about genetic screening. Women at a high risk of breast cancer should talk to their caregivers about having magnetic resonance imaging (MRI) and a mammography every year.   The Pap test is a screening test for cervical cancer. A Pap test can show cell changes on the cervix that might become cervical cancer if left untreated. A Pap test is a procedure in which cells are obtained and examined from the lower end of the uterus (cervix).   Women should have a Pap test starting at age 65.   Between ages 31 and 61, Pap tests should be repeated every 2 years.   Beginning at age 41, you should have a Pap test every 3 years as long as the past 3 Pap tests have been normal.   Some women have medical problems that increase the chance of getting cervical cancer. Talk to your caregiver about these problems. It is especially important to talk  to your caregiver if a new problem develops soon after your last Pap test. In these cases, your caregiver may recommend more frequent screening and Pap tests.   The above recommendations are the same for women who have or have not gotten the vaccine for human papillomavirus (HPV).   If you had a hysterectomy for a problem that was not cancer or a condition that could lead to cancer, then you no longer need Pap tests. Even if you no longer need a Pap test, a regular exam is a good idea to make sure no other problems are starting.   If you are between ages 4 and 66, and you have had normal Pap tests going back 10 years, you no longer need Pap tests. Even if you no longer need a Pap test, a regular exam is a good idea to make sure no other problems are starting.   If you have had past treatment for cervical cancer or a condition that could lead  to cancer, you need Pap tests and screening for cancer for at least 20 years after your treatment.   If Pap tests have been discontinued, risk factors (such as a new sexual partner) need to be reassessed to determine if screening should be resumed.   The HPV test is an additional test that may be used for cervical cancer screening. The HPV test looks for the virus that can cause the cell changes on the cervix. The cells collected during the Pap test can be tested for HPV. The HPV test could be used to screen women aged 71 years and older, and should be used in women of any age who have unclear Pap test results. After the age of 66, women should have HPV testing at the same frequency as a Pap test.   Colorectal cancer can be detected and often prevented. Most routine colorectal cancer screening begins at the age of 41 and continues through age 71. However, your caregiver may recommend screening at an earlier age if you have risk factors for colon cancer. On a yearly basis, your caregiver may provide home test kits to check for hidden blood in the stool. Use of a  small camera at the end of a tube, to directly examine the colon (sigmoidoscopy or colonoscopy), can detect the earliest forms of colorectal cancer. Talk to your caregiver about this at age 57, when routine screening begins.  Direct examination of the colon should be repeated every 5 to 10 years through age 46, unless early forms of pre-cancerous polyps or small growths are found.   Hepatitis C blood testing is recommended for all people born from [birth year] through 1965 and any individual with known risks for hepatitis C.   Practice safe sex. Use condoms and avoid high-risk sexual practices to reduce the spread of sexually transmitted infections (STIs). STIs include gonorrhea, chlamydia, syphilis, trichomonas, herpes, HPV, and human immunodeficiency virus (HIV). Herpes, HIV, and HPV are viral illnesses that have no cure. They can result in disability, cancer, and death. Sexually active women aged 62 and younger should be checked for chlamydia. Older women with new or multiple partners should also be tested for chlamydia. Testing for other STIs is recommended if you are sexually active and at increased risk.   Osteoporosis is a disease in which the bones lose minerals and strength with aging. This can result in serious bone fractures. The risk of osteoporosis can be identified using a bone density scan. Women ages 28 and over and women at risk for fractures or osteoporosis should discuss screening with their caregivers. Ask your caregiver whether you should take a calcium supplement or vitamin D to reduce the rate of osteoporosis.   Menopause can be associated with physical symptoms and risks. Hormone replacement therapy is available to decrease symptoms and risks. You should talk to your caregiver about whether hormone replacement therapy is right for you.   Use sunscreen with sun protection factor (SPF) of 30 or more. Apply sunscreen liberally and repeatedly throughout the day. You should seek shade when your  shadow is shorter than you. Protect yourself by wearing long sleeves, pants, a wide-brimmed hat, and sunglasses year round, whenever you are outdoors.   Once a month, do a whole body skin exam, using a mirror to look at the skin on your back. Notify your caregiver of new moles, moles that have irregular borders, moles that are larger than a pencil eraser, or moles that have changed in shape or color.   Stay  current with required immunizations.   Influenza. You need a dose every fall (or winter). The composition of the flu vaccine changes each year, so being vaccinated once is not enough.   Pneumococcal polysaccharide. You need 1 to 2 doses if you smoke cigarettes or if you have certain chronic medical conditions. You need 1 dose at age 27 (or older) if you have never been vaccinated.   Tetanus, diphtheria, pertussis (Tdap, Td). Get 1 dose of Tdap vaccine if you are younger than age 22, are over 16 and have contact with an infant, are a Research scientist (physical sciences), are pregnant, or simply want to be protected from whooping cough. After that, you need a Td booster dose every 10 years. Consult your caregiver if you have not had at least 3 tetanus and diphtheria-containing shots sometime in your life or have a deep or dirty wound.   HPV. You need this vaccine if you are a woman age 69 or younger. The vaccine is given in 3 doses over 6 months.   Measles, mumps, rubella (MMR). You need at least 1 dose of MMR if you were born in 1957 or later. You may also need a second dose.   Meningococcal. If you are age 1 to 98 and a first-year college student living in a residence hall, or have one of several medical conditions, you need to get vaccinated against meningococcal disease. You may also need additional booster doses.   Zoster (shingles). If you are age 32 or older, you should get this vaccine.   Varicella (chickenpox). If you have never had chickenpox or you were vaccinated but received only 1 dose, talk to your  caregiver to find out if you need this vaccine.   Hepatitis A. You need this vaccine if you have a specific risk factor for hepatitis A virus infection or you simply wish to be protected from this disease. The vaccine is usually given as 2 doses, 6 to 18 months apart.   Hepatitis B. You need this vaccine if you have a specific risk factor for hepatitis B virus infection or you simply wish to be protected from this disease. The vaccine is given in 3 doses, usually over 6 months.  Preventive Services / Frequency Ages 70 to 78  Blood pressure check.** / Every 1 to 2 years.   Lipid and cholesterol check.** / Every 5 years beginning at age 33.   Clinical breast exam.** / Every 3 years for women in their 17s and 30s.   Pap test.** / Every 2 years from ages 53 through 61. Every 3 years starting at age 73 through age 42 or 75 with a history of 3 consecutive normal Pap tests.   HPV screening.** / Every 3 years from ages 71 through ages 62 to 27 with a history of 3 consecutive normal Pap tests.   Hepatitis C blood test.** / For any individual with known risks for hepatitis C.   Skin self-exam. / Monthly.   Influenza immunization.** / Every year.   Pneumococcal polysaccharide immunization.** / 1 to 2 doses if you smoke cigarettes or if you have certain chronic medical conditions.   Tetanus, diphtheria, pertussis (Tdap, Td) immunization. / A one-time dose of Tdap vaccine. After that, you need a Td booster dose every 10 years.   HPV immunization. / 3 doses over 6 months, if you are 58 and younger.   Measles, mumps, rubella (MMR) immunization. / You need at least 1 dose of MMR if you were born in  1958 or later. You may also need a second dose.   Meningococcal immunization. / 1 dose if you are age 52 to 58 and a first-year college student living in a residence hall, or have one of several medical conditions, you need to get vaccinated against meningococcal disease. You may also need additional  booster doses.   Varicella immunization.** / Consult your caregiver.   Hepatitis A immunization.** / Consult your caregiver. 2 doses, 6 to 18 months apart.   Hepatitis B immunization.** / Consult your caregiver. 3 doses usually over 6 months.  Ages 70 to 55  Blood pressure check.** / Every 1 to 2 years.   Lipid and cholesterol check.** / Every 5 years beginning at age 41.   Clinical breast exam.** / Every year after age 69.   Mammogram.** / Every year beginning at age 55 and continuing for as long as you are in good health. Consult with your caregiver.   Pap test.** / Every 3 years starting at age 53 through age 11 or 8 with a history of 3 consecutive normal Pap tests.   HPV screening.** / Every 3 years from ages 10 through ages 43 to 8 with a history of 3 consecutive normal Pap tests.   Fecal occult blood test (FOBT) of stool. / Every year beginning at age 54 and continuing until age 89. You may not need to do this test if you get a colonoscopy every 10 years.   Flexible sigmoidoscopy or colonoscopy.** / Every 5 years for a flexible sigmoidoscopy or every 10 years for a colonoscopy beginning at age 72 and continuing until age 2.   Hepatitis C blood test.** / For all people born from [birth year] through 1965 and any individual with known risks for hepatitis C.   Skin self-exam. / Monthly.   Influenza immunization.** / Every year.   Pneumococcal polysaccharide immunization.** / 1 to 2 doses if you smoke cigarettes or if you have certain chronic medical conditions.   Tetanus, diphtheria, pertussis (Tdap, Td) immunization.** / A one-time dose of Tdap vaccine. After that, you need a Td booster dose every 10 years.   Measles, mumps, rubella (MMR) immunization. / You need at least 1 dose of MMR if you were born in 1957 or later. You may also need a second dose.   Varicella immunization.** / Consult your caregiver.   Meningococcal immunization.** / Consult your caregiver.   Hepatitis A  immunization.** / Consult your caregiver. 2 doses, 6 to 18 months apart.   Hepatitis B immunization.** / Consult your caregiver. 3 doses, usually over 6 months.  Ages 46 and over  Blood pressure check.** / Every 1 to 2 years.   Lipid and cholesterol check.** / Every 5 years beginning at age 77.   Clinical breast exam.** / Every year after age 77.   Mammogram.** / Every year beginning at age 76 and continuing for as long as you are in good health. Consult with your caregiver.   Pap test.** / Every 3 years starting at age 30 through age 56 or 56 with a 3 consecutive normal Pap tests. Testing can be stopped between 65 and 70 with 3 consecutive normal Pap tests and no abnormal Pap or HPV tests in the past 10 years.   HPV screening.** / Every 3 years from ages 62 through ages 43 or 71 with a history of 3 consecutive normal Pap tests. Testing can be stopped between 65 and 70 with 3 consecutive normal Pap tests and no abnormal  Pap or HPV tests in the past 10 years.   Fecal occult blood test (FOBT) of stool. / Every year beginning at age 36 and continuing until age 67. You may not need to do this test if you get a colonoscopy every 10 years.   Flexible sigmoidoscopy or colonoscopy.** / Every 5 years for a flexible sigmoidoscopy or every 10 years for a colonoscopy beginning at age 84 and continuing until age 29.   Hepatitis C blood test.** / For all people born from [birth year] through 1966 and any individual with known risks for hepatitis C.   Osteoporosis screening.** / A one-time screening for women ages 40 and over and women at risk for fractures or osteoporosis.   Skin self-exam. / Monthly.   Influenza immunization.** / Every year.   Pneumococcal polysaccharide immunization.** / 1 dose at age 23 (or older) if you have never been vaccinated.   Tetanus, diphtheria, pertussis (Tdap, Td) immunization. / A one-time dose of Tdap vaccine if you are over 65 and have contact with an infant, are a  Research scientist (physical sciences), or simply want to be protected from whooping cough. After that, you need a Td booster dose every 10 years.   Varicella immunization.** / Consult your caregiver.   Meningococcal immunization.** / Consult your caregiver.   Hepatitis A immunization.** / Consult your caregiver. 2 doses, 6 to 18 months apart.   Hepatitis B immunization.** / Check with your caregiver. 3 doses, usually over 6 months.  ** Family history and personal history of risk and conditions may change your caregiver's recommendations. Document Released: 02/25/2001 Document Revised: 12/19/2010 Document Reviewed: 05/27/2010 [facility] Patient Information 2012 [place], [place].

## 2011-09-18 ENCOUNTER — Other Ambulatory Visit: Payer: Self-pay | Admitting: Endocrinology

## 2012-02-14 ENCOUNTER — Other Ambulatory Visit: Payer: Self-pay | Admitting: Endocrinology

## 2012-02-16 NOTE — Telephone Encounter (Signed)
Please redirect this request to dr Felicity Coyer, as she now manages the thyroid medication

## 2012-02-16 NOTE — Telephone Encounter (Signed)
Pharmacy refill for levothyroxine. Last OV with DR, Felicity Coyer 07/02/2011. Last refill of med by Dr. Everardo All 02/20/2011. Please advise.

## 2012-04-29 ENCOUNTER — Encounter: Payer: Self-pay | Admitting: *Deleted

## 2012-04-29 ENCOUNTER — Ambulatory Visit: Payer: Self-pay | Admitting: Obstetrics & Gynecology

## 2012-04-29 DIAGNOSIS — Z01419 Encounter for gynecological examination (general) (routine) without abnormal findings: Secondary | ICD-10-CM

## 2012-06-15 ENCOUNTER — Encounter: Payer: Self-pay | Admitting: Internal Medicine

## 2012-06-15 ENCOUNTER — Ambulatory Visit (INDEPENDENT_AMBULATORY_CARE_PROVIDER_SITE_OTHER): Payer: BC Managed Care – PPO | Admitting: Internal Medicine

## 2012-06-15 ENCOUNTER — Other Ambulatory Visit (INDEPENDENT_AMBULATORY_CARE_PROVIDER_SITE_OTHER): Payer: BC Managed Care – PPO

## 2012-06-15 VITALS — BP 102/70 | HR 90 | Temp 98.0°F | Wt 160.8 lb

## 2012-06-15 DIAGNOSIS — Z0000 Encounter for general adult medical examination without abnormal findings: Secondary | ICD-10-CM

## 2012-06-15 LAB — HEPATIC FUNCTION PANEL
ALT: 21 U/L (ref 0–35)
AST: 26 U/L (ref 0–37)
Albumin: 3.8 g/dL (ref 3.5–5.2)
Alkaline Phosphatase: 54 U/L (ref 39–117)
Total Protein: 6.7 g/dL (ref 6.0–8.3)

## 2012-06-15 LAB — BASIC METABOLIC PANEL
BUN: 11 mg/dL (ref 6–23)
Creatinine, Ser: 0.6 mg/dL (ref 0.4–1.2)
GFR: 105.47 mL/min (ref >60.00)
Potassium: 4.6 mEq/L (ref 3.5–5.1)

## 2012-06-15 LAB — CBC WITH DIFFERENTIAL/PLATELET
Basophils Absolute: 0 10*3/uL (ref 0.0–0.1)
Basophils Relative: 0.4 % (ref 0.0–3.0)
Eosinophils Absolute: 0.2 10*3/uL (ref 0.0–0.7)
MCHC: 33.9 g/dL (ref 30.0–36.0)
MCV: 91.3 fl (ref 78.0–100.0)
Monocytes Absolute: 0.5 10*3/uL (ref 0.1–1.0)
Neutrophils Relative %: 64 % (ref 43.0–77.0)
RBC: 3.98 Mil/uL (ref 3.87–5.11)
RDW: 13.8 % (ref 11.5–14.6)

## 2012-06-15 LAB — TSH: TSH: 2.81 u[IU]/mL (ref 0.35–5.50)

## 2012-06-15 LAB — URINALYSIS, ROUTINE W REFLEX MICROSCOPIC
Bilirubin Urine: NEGATIVE
Ketones, ur: NEGATIVE
Leukocytes, UA: NEGATIVE
Urobilinogen, UA: 0.2 (ref 0.0–1.0)

## 2012-06-15 LAB — LIPID PANEL
Cholesterol: 160 mg/dL (ref 0–200)
HDL: 50.6 mg/dL (ref >39.00)
LDL Cholesterol: 94 mg/dL (ref 0–99)
Triglycerides: 75 mg/dL (ref 0.0–149.0)
VLDL: 15 mg/dL (ref 0.0–40.0)

## 2012-06-15 MED ORDER — LEVOTHYROXINE SODIUM 112 MCG PO TABS: 112.0000 ug | ORAL_TABLET | Freq: Every day | ORAL | Status: DC

## 2012-06-15 NOTE — Patient Instructions (Signed)
It was good to see you today. We have reviewed your prior records including labs and tests today Health Maintenance reviewed - all recommended immunizations and age-appropriate screenings are up-to-date.  Medications reviewed, no changes at this time. Refill on medication(s) as discussed today. Test(s) ordered today. Your results will be released to MyChart (or called to you) after review, usually within 72hours after test completion. If any changes need to be made, you will be notified at that same time. Please schedule followup in 1 year for medical physical and labs, call sooner if problems.  Preventive Care for Adults, Female A healthy lifestyle and preventive care can promote health and wellness. Preventive health guidelines for women include the following key practices.  A routine yearly physical is a good way to check with your caregiver about your health and preventive screening. It is a chance to share any concerns and updates on your health, and to receive a thorough exam.   Visit your dentist for a routine exam and preventive care every 6 months. Brush your teeth twice a day and floss once a day. Good oral hygiene prevents tooth decay and gum disease.   The frequency of eye exams is based on your age, health, family medical history, use of contact lenses, and other factors. Follow your caregiver's recommendations for frequency of eye exams.   Eat a healthy diet. Foods like vegetables, fruits, whole grains, low-fat dairy products, and lean protein foods contain the nutrients you need without too many calories. Decrease your intake of foods high in solid fats, added sugars, and salt. Eat the right amount of calories for you. Get information about a proper diet from your caregiver, if necessary.   Regular physical exercise is one of the most important things you can do for your health. Most adults should get at least 150 minutes of moderate-intensity exercise (any activity that increases  your heart rate and causes you to sweat) each week. In addition, most adults need muscle-strengthening exercises on 2 or more days a week.   Maintain a healthy weight. The body mass index (BMI) is a screening tool to identify possible weight problems. It provides an estimate of body fat based on height and weight. Your caregiver can help determine your BMI, and can help you achieve or maintain a healthy weight. For adults 20 years and older:   A BMI below 18.5 is considered underweight.   A BMI of 18.5 to 24.9 is normal.   A BMI of 25 to 29.9 is considered overweight.   A BMI of 30 and above is considered obese.   Maintain normal blood lipids and cholesterol levels by exercising and minimizing your intake of saturated fat. Eat a balanced diet with plenty of fruit and vegetables. Blood tests for lipids and cholesterol should begin at age 56 and be repeated every 5 years. If your lipid or cholesterol levels are high, you are over 50, or you are at high risk for heart disease, you may need your cholesterol levels checked more frequently. Ongoing high lipid and cholesterol levels should be treated with medicines if diet and exercise are not effective.   If you smoke, find out from your caregiver how to quit. If you do not use tobacco, do not start.   If you are pregnant, do not drink alcohol. If you are breastfeeding, be very cautious about drinking alcohol. If you are not pregnant and choose to drink alcohol, do not exceed 1 drink per day. One drink is considered to  be 12 ounces (355 mL) of beer, 5 ounces (148 mL) of wine, or 1.5 ounces (44 mL) of liquor.   Avoid use of street drugs. Do not share needles with anyone. Ask for help if you need support or instructions about stopping the use of drugs.   High blood pressure causes heart disease and increases the risk of stroke. Your blood pressure should be checked at least every 1 to 2 years. Ongoing high blood pressure should be treated with medicines  if weight loss and exercise are not effective.   If you are 36 to 48 years old, ask your caregiver if you should take aspirin to prevent strokes.   Diabetes screening involves taking a blood sample to check your fasting blood sugar level. This should be done once every 3 years, after age 10, if you are within normal weight and without risk factors for diabetes. Testing should be considered at a younger age or be carried out more frequently if you are overweight and have at least 1 risk factor for diabetes.   Breast cancer screening is essential preventive care for women. You should practice "breast self-awareness." This means understanding the normal appearance and feel of your breasts and may include breast self-examination. Any changes detected, no matter how small, should be reported to a caregiver. Women in their 34s and 30s should have a clinical breast exam (CBE) by a caregiver as part of a regular health exam every 1 to 3 years. After age 55, women should have a CBE every year. Starting at age 64, women should consider having a mammography (breast X-ray test) every year. Women who have a family history of breast cancer should talk to their caregiver about genetic screening. Women at a high risk of breast cancer should talk to their caregivers about having magnetic resonance imaging (MRI) and a mammography every year.   The Pap test is a screening test for cervical cancer. A Pap test can show cell changes on the cervix that might become cervical cancer if left untreated. A Pap test is a procedure in which cells are obtained and examined from the lower end of the uterus (cervix).   Women should have a Pap test starting at age 44.   Between ages 61 and 56, Pap tests should be repeated every 2 years.   Beginning at age 20, you should have a Pap test every 3 years as long as the past 3 Pap tests have been normal.   Some women have medical problems that increase the chance of getting cervical  cancer. Talk to your caregiver about these problems. It is especially important to talk to your caregiver if a new problem develops soon after your last Pap test. In these cases, your caregiver may recommend more frequent screening and Pap tests.   The above recommendations are the same for women who have or have not gotten the vaccine for human papillomavirus (HPV).   If you had a hysterectomy for a problem that was not cancer or a condition that could lead to cancer, then you no longer need Pap tests. Even if you no longer need a Pap test, a regular exam is a good idea to make sure no other problems are starting.   If you are between ages 40 and 65, and you have had normal Pap tests going back 10 years, you no longer need Pap tests. Even if you no longer need a Pap test, a regular exam is a good idea to make sure  no other problems are starting.   If you have had past treatment for cervical cancer or a condition that could lead to cancer, you need Pap tests and screening for cancer for at least 20 years after your treatment.   If Pap tests have been discontinued, risk factors (such as a new sexual partner) need to be reassessed to determine if screening should be resumed.   The HPV test is an additional test that may be used for cervical cancer screening. The HPV test looks for the virus that can cause the cell changes on the cervix. The cells collected during the Pap test can be tested for HPV. The HPV test could be used to screen women aged 4 years and older, and should be used in women of any age who have unclear Pap test results. After the age of 45, women should have HPV testing at the same frequency as a Pap test.   Colorectal cancer can be detected and often prevented. Most routine colorectal cancer screening begins at the age of 69 and continues through age 30. However, your caregiver may recommend screening at an earlier age if you have risk factors for colon cancer. On a yearly basis, your  caregiver may provide home test kits to check for hidden blood in the stool. Use of a small camera at the end of a tube, to directly examine the colon (sigmoidoscopy or colonoscopy), can detect the earliest forms of colorectal cancer. Talk to your caregiver about this at age 110, when routine screening begins.  Direct examination of the colon should be repeated every 5 to 10 years through age 67, unless early forms of pre-cancerous polyps or small growths are found.   Hepatitis C blood testing is recommended for all people born from [birth year] through 1965 and any individual with known risks for hepatitis C.   Practice safe sex. Use condoms and avoid high-risk sexual practices to reduce the spread of sexually transmitted infections (STIs). STIs include gonorrhea, chlamydia, syphilis, trichomonas, herpes, HPV, and human immunodeficiency virus (HIV). Herpes, HIV, and HPV are viral illnesses that have no cure. They can result in disability, cancer, and death. Sexually active women aged 58 and younger should be checked for chlamydia. Older women with new or multiple partners should also be tested for chlamydia. Testing for other STIs is recommended if you are sexually active and at increased risk.   Osteoporosis is a disease in which the bones lose minerals and strength with aging. This can result in serious bone fractures. The risk of osteoporosis can be identified using a bone density scan. Women ages 44 and over and women at risk for fractures or osteoporosis should discuss screening with their caregivers. Ask your caregiver whether you should take a calcium supplement or vitamin D to reduce the rate of osteoporosis.   Menopause can be associated with physical symptoms and risks. Hormone replacement therapy is available to decrease symptoms and risks. You should talk to your caregiver about whether hormone replacement therapy is right for you.   Use sunscreen with sun protection factor (SPF) of 30 or more. Apply  sunscreen liberally and repeatedly throughout the day. You should seek shade when your shadow is shorter than you. Protect yourself by wearing long sleeves, pants, a wide-brimmed hat, and sunglasses year round, whenever you are outdoors.   Once a month, do a whole body skin exam, using a mirror to look at the skin on your back. Notify your caregiver of new moles, moles that have  irregular borders, moles that are larger than a pencil eraser, or moles that have changed in shape or color.   Stay current with required immunizations.   Influenza. You need a dose every fall (or winter). The composition of the flu vaccine changes each year, so being vaccinated once is not enough.   Pneumococcal polysaccharide. You need 1 to 2 doses if you smoke cigarettes or if you have certain chronic medical conditions. You need 1 dose at age 68 (or older) if you have never been vaccinated.   Tetanus, diphtheria, pertussis (Tdap, Td). Get 1 dose of Tdap vaccine if you are younger than age 29, are over 55 and have contact with an infant, are a Research scientist (physical sciences), are pregnant, or simply want to be protected from whooping cough. After that, you need a Td booster dose every 10 years. Consult your caregiver if you have not had at least 3 tetanus and diphtheria-containing shots sometime in your life or have a deep or dirty wound.   HPV. You need this vaccine if you are a woman age 22 or younger. The vaccine is given in 3 doses over 6 months.   Measles, mumps, rubella (MMR). You need at least 1 dose of MMR if you were born in 1957 or later. You may also need a second dose.   Meningococcal. If you are age 33 to 23 and a first-year college student living in a residence hall, or have one of several medical conditions, you need to get vaccinated against meningococcal disease. You may also need additional booster doses.   Zoster (shingles). If you are age 8 or older, you should get this vaccine.   Varicella (chickenpox). If you  have never had chickenpox or you were vaccinated but received only 1 dose, talk to your caregiver to find out if you need this vaccine.   Hepatitis A. You need this vaccine if you have a specific risk factor for hepatitis A virus infection or you simply wish to be protected from this disease. The vaccine is usually given as 2 doses, 6 to 18 months apart.   Hepatitis B. You need this vaccine if you have a specific risk factor for hepatitis B virus infection or you simply wish to be protected from this disease. The vaccine is given in 3 doses, usually over 6 months.  Preventive Services / Frequency Ages 44 to 13  Blood pressure check.** / Every 1 to 2 years.   Lipid and cholesterol check.** / Every 5 years beginning at age 49.   Clinical breast exam.** / Every 3 years for women in their 35s and 30s.   Pap test.** / Every 2 years from ages 35 through 101. Every 3 years starting at age 43 through age 28 or 2 with a history of 3 consecutive normal Pap tests.   HPV screening.** / Every 3 years from ages 62 through ages 43 to 90 with a history of 3 consecutive normal Pap tests.   Hepatitis C blood test.** / For any individual with known risks for hepatitis C.   Skin self-exam. / Monthly.   Influenza immunization.** / Every year.   Pneumococcal polysaccharide immunization.** / 1 to 2 doses if you smoke cigarettes or if you have certain chronic medical conditions.   Tetanus, diphtheria, pertussis (Tdap, Td) immunization. / A one-time dose of Tdap vaccine. After that, you need a Td booster dose every 10 years.   HPV immunization. / 3 doses over 6 months, if you are 26 and  younger.   Measles, mumps, rubella (MMR) immunization. / You need at least 1 dose of MMR if you were born in 1957 or later. You may also need a second dose.   Meningococcal immunization. / 1 dose if you are age 79 to 74 and a first-year college student living in a residence hall, or have one of several medical conditions, you  need to get vaccinated against meningococcal disease. You may also need additional booster doses.   Varicella immunization.** / Consult your caregiver.   Hepatitis A immunization.** / Consult your caregiver. 2 doses, 6 to 18 months apart.   Hepatitis B immunization.** / Consult your caregiver. 3 doses usually over 6 months.  Ages 69 to 60  Blood pressure check.** / Every 1 to 2 years.   Lipid and cholesterol check.** / Every 5 years beginning at age 37.   Clinical breast exam.** / Every year after age 29.   Mammogram.** / Every year beginning at age 46 and continuing for as long as you are in good health. Consult with your caregiver.   Pap test.** / Every 3 years starting at age 19 through age 64 or 63 with a history of 3 consecutive normal Pap tests.   HPV screening.** / Every 3 years from ages 4 through ages 17 to 79 with a history of 3 consecutive normal Pap tests.   Fecal occult blood test (FOBT) of stool. / Every year beginning at age 4 and continuing until age 60. You may not need to do this test if you get a colonoscopy every 10 years.   Flexible sigmoidoscopy or colonoscopy.** / Every 5 years for a flexible sigmoidoscopy or every 10 years for a colonoscopy beginning at age 95 and continuing until age 96.   Hepatitis C blood test.** / For all people born from [birth year] through 1966 and any individual with known risks for hepatitis C.   Skin self-exam. / Monthly.   Influenza immunization.** / Every year.   Pneumococcal polysaccharide immunization.** / 1 to 2 doses if you smoke cigarettes or if you have certain chronic medical conditions.   Tetanus, diphtheria, pertussis (Tdap, Td) immunization.** / A one-time dose of Tdap vaccine. After that, you need a Td booster dose every 10 years.   Measles, mumps, rubella (MMR) immunization. / You need at least 1 dose of MMR if you were born in 1957 or later. You may also need a second dose.   Varicella immunization.** / Consult your  caregiver.   Meningococcal immunization.** / Consult your caregiver.   Hepatitis A immunization.** / Consult your caregiver. 2 doses, 6 to 18 months apart.   Hepatitis B immunization.** / Consult your caregiver. 3 doses, usually over 6 months.  Ages 89 and over  Blood pressure check.** / Every 1 to 2 years.   Lipid and cholesterol check.** / Every 5 years beginning at age 68.   Clinical breast exam.** / Every year after age 75.   Mammogram.** / Every year beginning at age 28 and continuing for as long as you are in good health. Consult with your caregiver.   Pap test.** / Every 3 years starting at age 49 through age 5 or 5 with a 3 consecutive normal Pap tests. Testing can be stopped between 65 and 70 with 3 consecutive normal Pap tests and no abnormal Pap or HPV tests in the past 10 years.   HPV screening.** / Every 3 years from ages 47 through ages 62 or 72 with a history of  3 consecutive normal Pap tests. Testing can be stopped between 65 and 70 with 3 consecutive normal Pap tests and no abnormal Pap or HPV tests in the past 10 years.   Fecal occult blood test (FOBT) of stool. / Every year beginning at age 81 and continuing until age 66. You may not need to do this test if you get a colonoscopy every 10 years.   Flexible sigmoidoscopy or colonoscopy.** / Every 5 years for a flexible sigmoidoscopy or every 10 years for a colonoscopy beginning at age 51 and continuing until age 72.   Hepatitis C blood test.** / For all people born from [birth year] through 1966 and any individual with known risks for hepatitis C.   Osteoporosis screening.** / A one-time screening for women ages 66 and over and women at risk for fractures or osteoporosis.   Skin self-exam. / Monthly.   Influenza immunization.** / Every year.   Pneumococcal polysaccharide immunization.** / 1 dose at age 64 (or older) if you have never been vaccinated.   Tetanus, diphtheria, pertussis (Tdap, Td) immunization. / A one-time  dose of Tdap vaccine if you are over 65 and have contact with an infant, are a Research scientist (physical sciences), or simply want to be protected from whooping cough. After that, you need a Td booster dose every 10 years.   Varicella immunization.** / Consult your caregiver.   Meningococcal immunization.** / Consult your caregiver.   Hepatitis A immunization.** / Consult your caregiver. 2 doses, 6 to 18 months apart.   Hepatitis B immunization.** / Check with your caregiver. 3 doses, usually over 6 months.  ** Family history and personal history of risk and conditions may change your caregiver's recommendations. Document Released: 02/25/2001 Document Revised: 12/19/2010 Document Reviewed: 05/27/2010 [facility] Patient Information 2012 [place], [place].

## 2012-06-15 NOTE — Progress Notes (Signed)
Subjective:    Patient ID: Kathryn Gregory, female    DOB: 05/21/1965, 47 y.o.   MRN: [id number]  HPI  patient is here today for annual physical. Patient feels well and has no complaints.  Past Medical History  Diagnosis Date  . Rosacea   . IBS (irritable bowel syndrome)   . ANEMIA-NOS   . GERD   . Other postablative hypothyroidism 07/2009    I-131ablation 07/2009 for hyperthyroid   Family History  Problem Relation Age of Onset  . Mental illness Mother   . Breast cancer Maternal Aunt   . Colon cancer Paternal Uncle   . Dementia Maternal Grandmother   . Breast cancer Cousin   . Cancer Other     cancer of tongue   History  Substance Use Topics  . Smoking status: Former Games developer  . Smokeless tobacco: Not on file     Comment: Married, lives with spouse since 2010. 2 grown kids in [place] in 2009 feom [place] to learn english  . Alcohol Use: No     Review of Systems Constitutional: Negative for fever or weight change.  Respiratory: Negative for cough and shortness of breath.   Cardiovascular: Negative for chest pain or palpitations.  Gastrointestinal: Negative for abdominal pain, no bowel changes.  Musculoskeletal: Negative for gait problem or joint swelling.  Skin: Negative for rash.  Neurological: Negative for dizziness or headache.  No other specific complaints in a complete review of systems (except as listed in HPI above).  .     Objective:   Physical Exam  BP 102/70  Pulse 90  Temp(Src) 98 F (36.7 C) (Oral)  Wt 160 lb 12.8 oz (72.938 kg)  BMI 25.57 kg/m2  SpO2 95% Wt Readings from Last 3 Encounters:  06/15/12 160 lb 12.8 oz (72.938 kg)  07/02/11 153 lb 12.8 oz (69.763 kg)  06/05/10 165 lb 12.8 oz (75.206 kg)   Constitutional: She appears well-developed and well-nourished. No distress.  HENT: Head: Normocephalic and atraumatic. Ears; B TMs ok, no erythema or effusion; Nose: Nose normal. Mouth/Throat: Oropharynx is clear and moist. No oropharyngeal exudate.   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 Pupils are equal, round, and reactive to light. No scleral icterus.  Neck: Normal range of motion. Neck supple. No JVD present. No thyromegaly present.  Cardiovascular: Normal rate, regular rhythm and normal heart sounds.  No murmur heard. No BLE edema. Pulmonary/Chest: Effort normal and breath sounds normal. No respiratory distress. She has no wheezes.  Abdominal: Soft. Bowel sounds are normal. She exhibits no distension. There is no tenderness.  Musculoskeletal: Normal range of motion, no joint effusions. No gross deformities Neurological: She is alert and oriented to person, place, and time. No cranial nerve deficit. Coordination normal.  Skin: mild facial rosacea - other skin is warm and dry. No rash noted. No erythema.  Psychiatric: She has a normal mood and affect. Her behavior is normal. Judgment and thought content normal.   Lab Results  Component Value Date   WBC 6.5 07/01/2011   HGB 11.9* 07/01/2011   HCT 36.5 07/01/2011   PLT 266.0 07/01/2011   CHOL 142 07/01/2011   TRIG 64.0 07/01/2011   HDL 55.60 07/01/2011   ALT 17 07/01/2011   AST 26 07/01/2011   NA 140 07/01/2011   K 4.2 07/01/2011   CL 107 07/01/2011   CREATININE 0.6 07/01/2011   BUN 11 07/01/2011   CO2 27 07/01/2011   TSH 2.60  07/01/2011        Assessment & Plan:  CPX - v70.0 - Patient has been counseled on age-appropriate routine health concerns for screening and prevention. These are reviewed and up-to-date. Immunizations are up-to-date or declined. Labs ordered and reviewed

## 2012-07-02 ENCOUNTER — Encounter: Payer: BC Managed Care – PPO | Admitting: Internal Medicine

## 2012-07-07 ENCOUNTER — Other Ambulatory Visit: Payer: Self-pay | Admitting: Internal Medicine

## 2012-10-31 ENCOUNTER — Ambulatory Visit: Payer: BC Managed Care – PPO | Admitting: Physician Assistant

## 2012-10-31 VITALS — BP 112/70 | HR 75 | Temp 98.1°F | Resp 18 | Ht 67.0 in | Wt 161.0 lb

## 2012-10-31 DIAGNOSIS — H1131 Conjunctival hemorrhage, right eye: Secondary | ICD-10-CM

## 2012-10-31 DIAGNOSIS — H1130 Conjunctival hemorrhage, unspecified eye: Secondary | ICD-10-CM

## 2012-10-31 LAB — POCT CBC
Granulocyte percent: 51 %G (ref 37–80)
HCT, POC: 36 % — AB (ref 37.7–47.9)
Hemoglobin: 11.3 g/dL — AB (ref 12.2–16.2)
Lymph, poc: 3.1 (ref 0.6–3.4)
MCH, POC: 29.8 pg (ref 27–31.2)
MCHC: 31.4 g/dL — AB (ref 31.8–35.4)
MCV: 95.1 fL (ref 80–97)
MID (cbc): 0.5 (ref 0–0.9)
MPV: 8.9 fL (ref 0–99.8)
POC Granulocyte: 3.7 (ref 2–6.9)
POC LYMPH PERCENT: 42.4 %L (ref 10–50)
POC MID %: 6.6 %M (ref 0–12)
Platelet Count, POC: 268 10*3/uL (ref 142–424)
RBC: 3.79 M/uL — AB (ref 4.04–5.48)
RDW, POC: 13.6 %
WBC: 7.3 10*3/uL (ref 4.6–10.2)

## 2012-10-31 NOTE — Patient Instructions (Signed)
Subconjunctival Hemorrhage °A subconjunctival hemorrhage is a bright red patch covering a portion of the Grosshans of the eye. The Monda part of the eye is called the sclera, and it is covered by a thin membrane called the conjunctiva. This membrane is clear, except for tiny blood vessels that you can see with the naked eye. When your eye is irritated or inflamed and becomes red, it is because the vessels in the conjunctiva are swollen. °Sometimes, a blood vessel in the conjunctiva can break and bleed. When this occurs, the blood builds up between the conjunctiva and the sclera, and spreads out to create a red area. The red spot may be very small at first. It may then spread to cover a larger part of the surface of the eye, or even all of the visible Bronkema part of the eye. °In almost all cases, the blood will go away and the eye will become Abt again. Before completely dissolving, however, the red area may spread. It may also become brownish-yellow in color, before going away. If a lot of blood collects under the conjunctiva, it may look like a bulge on the surface of the eye. This looks scary, but it will also eventually flatten out and go away. Subconjunctival hemorrhages do not cause pain, but if swollen, may cause a feeling of irritation. There is no effect on vision.  °CAUSES  °· The most common cause is mild trauma (rubbing the eye, irritation). °· Subconjunctival hemorrhages can happen because of coughing or straining (lifting heavy objects), vomiting, or sneezing. °· In some cases, your doctor may want to check your blood pressure. High blood pressure can also cause a sunconjunctival hemorrhage. °· Severe trauma or blunt injuries. °· Diseases that affect blood clotting (hemophilia, leukemia). °· Abnormalities of blood vessels behind the eye (carotid cavernous sinus fistula). °· Tumors behind the eye. °· Certain drugs (aspirin, coumadin, heparin). °· Recent eye surgery. °HOME CARE INSTRUCTIONS  °· Do not worry  about the appearance of your eye. You may continue your usual activities. °· Often, follow-up is not necessary. °SEEK MEDICAL CARE IF:  °· Your eye becomes painful. °· The bleeding does not disappear within 3 weeks. °· Bleeding occurs elsewhere, for example, under the skin, in the mouth, or in the other eye. °· You have recurring subconjunctival hemorrhages. °SEEK IMMEDIATE MEDICAL CARE IF:  °· Your vision changes or you have difficulty seeing. °· You develop severe headache, persistent vomiting, confusion, or abnormal drowsiness (lethargy). °· Your eye seems to bulge or protrude from the eye socket. °· You notice the sudden appearance of bruises, or have spontaneous bleeding elsewhere on your body. °Document Released: 12/30/2004 Document Revised: 03/24/2011 Document Reviewed: 11/27/2008 °[employer]® Patient Information ©2014 [employer]. ° °

## 2012-10-31 NOTE — Progress Notes (Signed)
  Subjective:    Patient ID: Kathryn Gregory, female    DOB: October 20, 1965, 47 y.o.   MRN: [id number]  HPI 47 year old female presents for evaluation of redness in right eye since this morning. States she noticed a small amount of redness in the medial corner of her right eye that through the day has slowly spread temporally. Denies any pain, drainage, or vision changes. Has slight headache x 3 days that is relieved with ibuprofen. No hx of similar symptoms.  No hx of easy bruising or bleeding.   Patient is otherwise healthy with no other concerns today.     Review of Systems  Constitutional: Negative for fever and chills.  HENT: Negative for congestion.   Eyes: Positive for redness. Negative for pain, discharge and visual disturbance.  Respiratory: Negative for cough.   Neurological: Positive for headaches. Negative for dizziness.       Objective:   Physical Exam  Constitutional: She is oriented to person, place, and time. She appears well-developed and well-nourished.  HENT:  Head: Normocephalic and atraumatic.  Right Ear: Hearing, tympanic membrane, external ear and ear canal normal.  Left Ear: Hearing, tympanic membrane, external ear and ear canal normal.  Mouth/Throat: Uvula is midline, oropharynx is clear and moist and mucous membranes are normal.  Eyes: Conjunctivae, EOM and lids are normal. Pupils are equal, round, and reactive to light.    Subconjunctival hemorrhage   Neck: Normal range of motion. Neck supple.  Cardiovascular: Normal rate.   Pulmonary/Chest: Effort normal and breath sounds normal.  Neurological: She is alert and oriented to person, place, and time.  Psychiatric: She has a normal mood and affect. Her behavior is normal. Judgment and thought content normal.    Results for orders placed in visit on 10/31/12  POCT CBC      Result Value Range   WBC 7.3  4.6 - 10.2 K/uL   Lymph, poc 3.1  0.6 - 3.4   POC LYMPH PERCENT 42.4  10 - 50 %L   MID (cbc) 0.5  0 - 0.9   POC MID % 6.6  0 - 12 %M   POC Granulocyte 3.7  2 - 6.9   Granulocyte percent 51.0  37 - 80 %G   RBC 3.79 (*) 4.04 - 5.48 M/uL   Hemoglobin 11.3 (*) 12.2 - 16.2 g/dL   HCT, POC 86.5 (*) 78.4 - 47.9 %   MCV 95.1  80 - 97 fL   MCH, POC 29.8  27 - 31.2 pg   MCHC 31.4 (*) 31.8 - 35.4 g/dL   RDW, POC 69.6     Platelet Count, POC 268  142 - 424 K/uL   MPV 8.9  0 - 99.8 fL         Assessment & Plan:  Subconjunctival hemorrhage of right eye - Plan: POCT CBC CBC reveals mild anemia. Recommend iron supplement or prenatal vitamin.  Reassurance provided Follow up as needed.

## 2012-11-18 ENCOUNTER — Other Ambulatory Visit: Payer: Self-pay

## 2013-06-14 ENCOUNTER — Telehealth: Payer: Self-pay | Admitting: *Deleted

## 2013-06-14 ENCOUNTER — Other Ambulatory Visit: Payer: BC Managed Care – PPO

## 2013-06-14 DIAGNOSIS — Z0000 Encounter for general adult medical examination without abnormal findings: Secondary | ICD-10-CM

## 2013-06-14 NOTE — Telephone Encounter (Signed)
Pt came in to have cpx labs done for physical 06/16/13. Entering cpx labs...Kathryn Gregory

## 2013-06-15 ENCOUNTER — Other Ambulatory Visit (INDEPENDENT_AMBULATORY_CARE_PROVIDER_SITE_OTHER): Payer: BC Managed Care – PPO

## 2013-06-15 DIAGNOSIS — Z0000 Encounter for general adult medical examination without abnormal findings: Secondary | ICD-10-CM

## 2013-06-15 LAB — HEPATIC FUNCTION PANEL
ALT: 20 U/L (ref 0–35)
AST: 25 U/L (ref 0–37)
Albumin: 4.1 g/dL (ref 3.5–5.2)
Alkaline Phosphatase: 58 U/L (ref 39–117)
BILIRUBIN DIRECT: 0.1 mg/dL (ref 0.0–0.3)
BILIRUBIN TOTAL: 0.8 mg/dL (ref 0.2–1.2)
Total Protein: 7 g/dL (ref 6.0–8.3)

## 2013-06-15 LAB — BASIC METABOLIC PANEL
BUN: 12 mg/dL (ref 6–23)
CO2: 28 mEq/L (ref 19–32)
CREATININE: 0.7 mg/dL (ref 0.4–1.2)
Calcium: 8.9 mg/dL (ref 8.4–10.5)
Chloride: 106 mEq/L (ref 96–112)
GFR: 103.16 mL/min (ref >60.00)
Glucose, Bld: 96 mg/dL (ref 70–99)
POTASSIUM: 4.3 meq/L (ref 3.5–5.1)
Sodium: 139 mEq/L (ref 135–145)

## 2013-06-15 LAB — URINALYSIS, ROUTINE W REFLEX MICROSCOPIC
Bilirubin Urine: NEGATIVE
Ketones, ur: NEGATIVE
LEUKOCYTES UA: NEGATIVE
Nitrite: NEGATIVE
SPECIFIC GRAVITY, URINE: 1.01 (ref 1.000–1.030)
Total Protein, Urine: NEGATIVE
URINE GLUCOSE: NEGATIVE
Urobilinogen, UA: 0.2 (ref 0.0–1.0)
pH: 6 (ref 5.0–8.0)

## 2013-06-15 LAB — CBC WITH DIFFERENTIAL/PLATELET
Basophils Absolute: 0 10*3/uL (ref 0.0–0.1)
Basophils Relative: 0.7 % (ref 0.0–3.0)
EOS PCT: 3.6 % (ref 0.0–5.0)
Eosinophils Absolute: 0.2 10*3/uL (ref 0.0–0.7)
HCT: 38 % (ref 36.0–46.0)
Hemoglobin: 12.5 g/dL (ref 12.0–15.0)
Lymphocytes Relative: 27.5 % (ref 12.0–46.0)
Lymphs Abs: 1.7 10*3/uL (ref 0.7–4.0)
MCHC: 32.9 g/dL (ref 30.0–36.0)
MCV: 87.5 fl (ref 78.0–100.0)
MONOS PCT: 6.1 % (ref 3.0–12.0)
Monocytes Absolute: 0.4 10*3/uL (ref 0.1–1.0)
NEUTROS PCT: 62.1 % (ref 43.0–77.0)
Neutro Abs: 3.9 10*3/uL (ref 1.4–7.7)
Platelets: 340 10*3/uL (ref 150.0–400.0)
RBC: 4.34 Mil/uL (ref 3.87–5.11)
RDW: 14.6 % (ref 11.5–15.5)
WBC: 6.2 10*3/uL (ref 4.0–10.5)

## 2013-06-15 LAB — LIPID PANEL
CHOL/HDL RATIO: 3
Cholesterol: 167 mg/dL (ref 0–200)
HDL: 50.3 mg/dL (ref >39.00)
LDL CALC: 101 mg/dL — AB (ref 0–99)
NonHDL: 116.7
TRIGLYCERIDES: 81 mg/dL (ref 0.0–149.0)
VLDL: 16.2 mg/dL (ref 0.0–40.0)

## 2013-06-15 LAB — TSH: TSH: 5.1 u[IU]/mL — ABNORMAL HIGH (ref 0.35–4.50)

## 2013-06-16 ENCOUNTER — Ambulatory Visit (INDEPENDENT_AMBULATORY_CARE_PROVIDER_SITE_OTHER): Payer: BC Managed Care – PPO | Admitting: Internal Medicine

## 2013-06-16 ENCOUNTER — Encounter: Payer: BC Managed Care – PPO | Admitting: Internal Medicine

## 2013-06-16 ENCOUNTER — Encounter: Payer: Self-pay | Admitting: Internal Medicine

## 2013-06-16 VITALS — BP 110/72 | HR 75 | Temp 98.2°F | Ht 66.0 in | Wt 162.8 lb

## 2013-06-16 DIAGNOSIS — Z0000 Encounter for general adult medical examination without abnormal findings: Secondary | ICD-10-CM

## 2013-06-16 DIAGNOSIS — Z1239 Encounter for other screening for malignant neoplasm of breast: Secondary | ICD-10-CM

## 2013-06-16 MED ORDER — LEVOTHYROXINE SODIUM 112 MCG PO TABS: ORAL_TABLET | ORAL | Status: DC

## 2013-06-16 MED ORDER — CICLOPIROX 8 % EX SOLN: CUTANEOUS | Status: DC

## 2013-06-16 NOTE — Progress Notes (Signed)
Pre visit review using our clinic review tool, if applicable. No additional management support is needed unless otherwise documented below in the visit note. 

## 2013-06-16 NOTE — Patient Instructions (Addendum)
It was good to see you today.  We have reviewed your prior records including labs and tests today  Health Maintenance reviewed - refer for mammogram -all other recommended immunizations and age-appropriate screenings are up-to-date.  Medications reviewed and updated, no changes recommended at this time. Refill on medication(s) as discussed today.  Please schedule followup in 12 months for annual exam and labs, call sooner if problems.  Health Maintenance, Female A healthy lifestyle and preventative care can promote health and wellness.  Maintain regular health, dental, and eye exams.  Eat a healthy diet. Foods like vegetables, fruits, whole grains, low-fat dairy products, and lean protein foods contain the nutrients you need without too many calories. Decrease your intake of foods high in solid fats, added sugars, and salt. Get information about a proper diet from your caregiver, if necessary.  Regular physical exercise is one of the most important things you can do for your health. Most adults should get at least 150 minutes of moderate-intensity exercise (any activity that increases your heart rate and causes you to sweat) each week. In addition, most adults need muscle-strengthening exercises on 2 or more days a week.   Maintain a healthy weight. The body mass index (BMI) is a screening tool to identify possible weight problems. It provides an estimate of body fat based on height and weight. Your caregiver can help determine your BMI, and can help you achieve or maintain a healthy weight. For adults 20 years and older:  A BMI below 18.5 is considered underweight.  A BMI of 18.5 to 24.9 is normal.  A BMI of 25 to 29.9 is considered overweight.  A BMI of 30 and above is considered obese.  Maintain normal blood lipids and cholesterol by exercising and minimizing your intake of saturated fat. Eat a balanced diet with plenty of fruits and vegetables. Blood tests for lipids and  cholesterol should begin at age 67 and be repeated every 5 years. If your lipid or cholesterol levels are high, you are over 50, or you are a high risk for heart disease, you may need your cholesterol levels checked more frequently.Ongoing high lipid and cholesterol levels should be treated with medicines if diet and exercise are not effective.  If you smoke, find out from your caregiver how to quit. If you do not use tobacco, do not start.  Lung cancer screening is recommended for adults aged 24 80 years who are at high risk for developing lung cancer because of a history of smoking. Yearly low-dose computed tomography (CT) is recommended for people who have at least a 30-pack-year history of smoking and are a current smoker or have quit within the past 15 years. A pack year of smoking is smoking an average of 1 pack of cigarettes a day for 1 year (for example: 1 pack a day for 30 years or 2 packs a day for 15 years). Yearly screening should continue until the smoker has stopped smoking for at least 15 years. Yearly screening should also be stopped for people who develop a health problem that would prevent them from having lung cancer treatment.  If you are pregnant, do not drink alcohol. If you are breastfeeding, be very cautious about drinking alcohol. If you are not pregnant and choose to drink alcohol, do not exceed 1 drink per day. One drink is considered to be 12 ounces (355 mL) of beer, 5 ounces (148 mL) of wine, or 1.5 ounces (44 mL) of liquor.  Avoid use of street  drugs. Do not share needles with anyone. Ask for help if you need support or instructions about stopping the use of drugs.  High blood pressure causes heart disease and increases the risk of stroke. Blood pressure should be checked at least every 1 to 2 years. Ongoing high blood pressure should be treated with medicines, if weight loss and exercise are not effective.  If you are 82 to 49 years old, ask your caregiver if you should  take aspirin to prevent strokes.  Diabetes screening involves taking a blood sample to check your fasting blood sugar level. This should be done once every 3 years, after age 43, if you are within normal weight and without risk factors for diabetes. Testing should be considered at a younger age or be carried out more frequently if you are overweight and have at least 1 risk factor for diabetes.  Breast cancer screening is essential preventative care for women. You should practice "breast self-awareness." This means understanding the normal appearance and feel of your breasts and may include breast self-examination. Any changes detected, no matter how small, should be reported to a caregiver. Women in their 9s and 30s should have a clinical breast exam (CBE) by a caregiver as part of a regular health exam every 1 to 3 years. After age 37, women should have a CBE every year. Starting at age 58, women should consider having a mammogram (breast X-ray) every year. Women who have a family history of breast cancer should talk to their caregiver about genetic screening. Women at a high risk of breast cancer should talk to their caregiver about having an MRI and a mammogram every year.  Breast cancer gene (BRCA)-related cancer risk assessment is recommended for women who have family members with BRCA-related cancers. BRCA-related cancers include breast, ovarian, tubal, and peritoneal cancers. Having family members with these cancers may be associated with an increased risk for harmful changes (mutations) in the breast cancer genes BRCA1 and BRCA2. Results of the assessment will determine the need for genetic counseling and BRCA1 and BRCA2 testing.  The Pap test is a screening test for cervical cancer. Women should have a Pap test starting at age 46. Between ages 78 and 28, Pap tests should be repeated every 2 years. Beginning at age 31, you should have a Pap test every 3 years as long as the past 3 Pap tests have  been normal. If you had a hysterectomy for a problem that was not cancer or a condition that could lead to cancer, then you no longer need Pap tests. If you are between ages 27 and 31, and you have had normal Pap tests going back 10 years, you no longer need Pap tests. If you have had past treatment for cervical cancer or a condition that could lead to cancer, you need Pap tests and screening for cancer for at least 20 years after your treatment. If Pap tests have been discontinued, risk factors (such as a new sexual partner) need to be reassessed to determine if screening should be resumed. Some women have medical problems that increase the chance of getting cervical cancer. In these cases, your caregiver may recommend more frequent screening and Pap tests.  The human papillomavirus (HPV) test is an additional test that may be used for cervical cancer screening. The HPV test looks for the virus that can cause the cell changes on the cervix. The cells collected during the Pap test can be tested for HPV. The HPV test could be  used to screen women aged 59 years and older, and should be used in women of any age who have unclear Pap test results. After the age of 67, women should have HPV testing at the same frequency as a Pap test.  Colorectal cancer can be detected and often prevented. Most routine colorectal cancer screening begins at the age of 80 and continues through age 21. However, your caregiver may recommend screening at an earlier age if you have risk factors for colon cancer. On a yearly basis, your caregiver may provide home test kits to check for hidden blood in the stool. Use of a small camera at the end of a tube, to directly examine the colon (sigmoidoscopy or colonoscopy), can detect the earliest forms of colorectal cancer. Talk to your caregiver about this at age 61, when routine screening begins. Direct examination of the colon should be repeated every 5 to 10 years through age 72, unless early  forms of pre-cancerous polyps or small growths are found.  Hepatitis C blood testing is recommended for all people born from [birth year] through 1966 and any individual with known risks for hepatitis C.  Practice safe sex. Use condoms and avoid high-risk sexual practices to reduce the spread of sexually transmitted infections (STIs). Sexually active women aged 19 and younger should be checked for Chlamydia, which is a common sexually transmitted infection. Older women with new or multiple partners should also be tested for Chlamydia. Testing for other STIs is recommended if you are sexually active and at increased risk.  Osteoporosis is a disease in which the bones lose minerals and strength with aging. This can result in serious bone fractures. The risk of osteoporosis can be identified using a bone density scan. Women ages 47 and over and women at risk for fractures or osteoporosis should discuss screening with their caregivers. Ask your caregiver whether you should be taking a calcium supplement or vitamin D to reduce the rate of osteoporosis.  Menopause can be associated with physical symptoms and risks. Hormone replacement therapy is available to decrease symptoms and risks. You should talk to your caregiver about whether hormone replacement therapy is right for you.  Use sunscreen. Apply sunscreen liberally and repeatedly throughout the day. You should seek shade when your shadow is shorter than you. Protect yourself by wearing long sleeves, pants, a wide-brimmed hat, and sunglasses year round, whenever you are outdoors.  Notify your caregiver of new moles or changes in moles, especially if there is a change in shape or color. Also notify your caregiver if a mole is larger than the size of a pencil eraser.  Stay current with your immunizations. Document Released: 07/15/2010 Document Revised: 04/26/2012 Document Reviewed: 07/15/2010 [facility] Patient Information 2014 [place].

## 2013-06-16 NOTE — Progress Notes (Signed)
Subjective:    Patient ID: Kathryn Gregory, female    DOB: [birth year], 48 y.o.   MRN: [id number]  HPI  patient is here today for annual physical. Patient feels well and has no complaints.  Also reviewed chronic medical issues and interval events  Past Medical History  Diagnosis Date  . Rosacea   . IBS (irritable bowel syndrome)   . ANEMIA-NOS   . GERD   . Other postablative hypothyroidism 07/2009    I-131ablation 07/2009 for hyperthyroid   Family History  Problem Relation Age of Onset  . Mental illness Mother   . Breast cancer Maternal Aunt   . Colon cancer Paternal Uncle   . Dementia Maternal Grandmother   . Breast cancer Cousin   . Cancer Other     cancer of tongue   History  Substance Use Topics  . Smoking status: Former Research scientist (life sciences)  . Smokeless tobacco: Not on file     Comment: Married, lives with spouse since 2010. 2 grown kids in [place] in 2009 feom [place] to learn [place]  . Alcohol Use: No    Review of Systems  Constitutional: Negative for fatigue and unexpected weight change.  Respiratory: Negative for cough, shortness of breath and wheezing.   Cardiovascular: Negative for chest pain, palpitations and leg swelling.  Gastrointestinal: Negative for nausea, abdominal pain and diarrhea.  Neurological: Negative for dizziness, weakness, light-headedness and headaches.  Psychiatric/Behavioral: Negative for dysphoric mood. The patient is not nervous/anxious.   All other systems reviewed and are negative.      Objective:   Physical Exam  BP 110/72  Pulse 75  Temp(Src) 98.2 F (36.8 C) (Oral)  Ht 5\' 6"  (1.676 m)  Wt 162 lb 12.8 oz (73.846 kg)  BMI 26.29 kg/m2  SpO2 97% Wt Readings from Last 3 Encounters:  06/16/13 162 lb 12.8 oz (73.846 kg)  10/31/12 161 lb (73.029 kg)  06/15/12 160 lb 12.8 oz (72.938 kg)   Constitutional: She appears well-developed and well-nourished. No distress.  HENT: Head: Normocephalic and atraumatic. Ears: B TMs ok, no erythema or  effusion; Nose: Nose normal. Mouth/Throat: Oropharynx is clear and moist. No oropharyngeal exudate. 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 Pupils are equal, round, and reactive to light. No scleral icterus.  Neck: Normal range of motion. Neck supple. No JVD present. No thyromegaly present.  Cardiovascular: Normal rate, regular rhythm and normal heart sounds.  No murmur heard. No BLE edema. Pulmonary/Chest: Effort normal and breath sounds normal. No respiratory distress. She has no wheezes.  Abdominal: Soft. Bowel sounds are normal. She exhibits no distension. There is no tenderness. no masses GU: defer to gyn Musculoskeletal: Normal range of motion, no joint effusions. No gross deformities Neurological: She is alert and oriented to person, place, and time. No cranial nerve deficit. Coordination, balance, strength, speech and gait are normal.  Skin: Skin is warm and dry. No rash noted. No erythema.  Psychiatric: She has a normal mood and affect. Her behavior is normal. Judgment and thought content normal.    Lab Results  Component Value Date   WBC 6.2 06/15/2013   HGB 12.5 06/15/2013   HCT 38.0 06/15/2013   PLT 340.0 06/15/2013   GLUCOSE 96 06/15/2013   CHOL 167 06/15/2013   TRIG 81.0 06/15/2013   HDL 50.30 06/15/2013   LDLCALC 101* 06/15/2013   ALT 20 06/15/2013   AST 25 06/15/2013   NA 139 06/15/2013   K 4.3 06/15/2013  CL 106 06/15/2013   CREATININE 0.7 06/15/2013   BUN 12 06/15/2013   CO2 28 06/15/2013   TSH 5.10* 06/15/2013    No results found.  ECG today. Normal sinus rhythm 73 beats per minute. No ST changes, ischemic changes or arrhythmia     Assessment & Plan:   CPX/v70.0 - Patient has been counseled on age-appropriate routine health concerns for screening and prevention. These are reviewed and up-to-date. Immunizations are up-to-date or declined. Labs and ECG reviewed.

## 2013-10-28 ENCOUNTER — Other Ambulatory Visit: Payer: Self-pay

## 2013-11-14 ENCOUNTER — Encounter: Payer: Self-pay | Admitting: Internal Medicine

## 2013-12-28 ENCOUNTER — Telehealth: Payer: Self-pay | Admitting: Obstetrics & Gynecology

## 2013-12-28 NOTE — Telephone Encounter (Signed)
Spoke with patient. Patient states that she started her cycle last week which usually lasts for 3 days. Patient has been spotting since. "I am having to wear a tampon because I am still having some spotting which is not usual for me. I also have had a headache and I never have these." Headaches began Saturday and have been off and on since. Denies any cramping, nausea, and vomiting. "I do not know what is going on but it is just not normal for me." Patient was last seen for aex 04/09/2011. Patient would like to schedule aex with Dr.Miller at this time. Advised patient of next aex date in February with Dr.Miller. Patient declines. "That is just too long. I guess I will just watch it and call back. I only want to come in once since they are going to have to examine me any way." Offered office visit with Dr.Miller at an earlier date but patient declines. "I do not want to have to come in twice." Offered earlier aex with another provider but patient declines. "I really only feel comfortable with Dr.Miller." Advised patient will speak with provider and return call to get scheduled. Patient is agreeable.

## 2013-12-28 NOTE — Telephone Encounter (Signed)
Patient is having some problems with her cycle and headaches. Patient last seen 04/09/2011.

## 2013-12-28 NOTE — Telephone Encounter (Signed)
Returning a call to Kathryn Gregory. °

## 2013-12-28 NOTE — Telephone Encounter (Signed)
Left message to call [place] at [phone].  Need to schedule patient OV with Milford Cage, FNP or Regina Eck CNM for evaluation (per Gay Filler). Schedule on a day where Dr.Miller is here so she may see patient as well if needed. Assure patient Dr.Miller will be available if any recommendations are needed. If anything further needs to be scheduled can be scheduled with Dr.Miller.

## 2013-12-28 NOTE — Telephone Encounter (Signed)
Left message to call Kaitlyn at [phone]. 

## 2013-12-28 NOTE — Telephone Encounter (Signed)
Spoke with patient. Appointment scheduled for 12/17 at 11am with Milford Cage, [place]. Advised Dr.Miller will be in the office and available if any further evaluation or recommendations are needed. Patient is agreeable.  Cc: Milford Cage, FNP  Routing to provider for final review. Patient agreeable to disposition. Will close encounter

## 2013-12-29 ENCOUNTER — Ambulatory Visit (INDEPENDENT_AMBULATORY_CARE_PROVIDER_SITE_OTHER): Payer: BC Managed Care – PPO | Admitting: Nurse Practitioner

## 2013-12-29 ENCOUNTER — Encounter: Payer: Self-pay | Admitting: Nurse Practitioner

## 2013-12-29 VITALS — BP 106/68 | HR 64 | Ht 66.25 in | Wt 167.0 lb

## 2013-12-29 DIAGNOSIS — N926 Irregular menstruation, unspecified: Secondary | ICD-10-CM

## 2013-12-29 NOTE — Progress Notes (Signed)
Subjective:     Patient ID: Kathryn Gregory, female   DOB: October 14, 1965, 48 y.o.   MRN: [id number]  HPI This 48 yo MW Fe with history of irregular menses.  She had LMP 12/19/13 which was her usual cycle timing.   Usual symptoms with her cycle and bleeding was normal.  Then spotting continued and just today has been the least amount.  Bleeding is described at spotting with wiping or use of a panty liner.  Her last menses in November her cycle was one week earlier.  Has no vaso symptoms.  S/P BTL. No change in partner.  Review of Systems  Constitutional: Positive for chills. Negative for fever and fatigue.  Respiratory: Negative.   Cardiovascular: Negative.   Gastrointestinal: Negative.   Genitourinary: Positive for vaginal bleeding.  Musculoskeletal: Negative.   Skin: Negative.   Psychiatric/Behavioral: Negative.        Objective:   Physical Exam  Constitutional: She is oriented to person, place, and time. She appears well-developed and well-nourished.  Abdominal: Soft. She exhibits no distension. There is no tenderness. There is no rebound and no guarding.  Genitourinary:  Very little vaginal spotting.  Cervical os is closed.  No adnexal tenderness or mass.  No flank pain.  Neurological: She is alert and oriented to person, place, and time.  Psychiatric: She has a normal mood and affect. Her behavior is normal. Judgment and thought content normal.       Assessment:     AUB consistent with perimenopause    Plan:     Check TSH Will call back if bleeding continues - may need to add Provera to stop this bleeding and may it coincide with next menses.

## 2013-12-29 NOTE — Patient Instructions (Signed)
Abnormal Uterine Bleeding Abnormal uterine bleeding can affect women at various stages in life, including teenagers, women in their reproductive years, pregnant women, and women who have reached menopause. Several kinds of uterine bleeding are considered abnormal, including:  Bleeding or spotting between periods.   Bleeding after sexual intercourse.   Bleeding that is heavier or more than normal.   Periods that last longer than usual.  Bleeding after menopause.  Many cases of abnormal uterine bleeding are minor and simple to treat, while others are more serious. Any type of abnormal bleeding should be evaluated by your health care provider. Treatment will depend on the cause of the bleeding. HOME CARE INSTRUCTIONS Monitor your condition for any changes. The following actions may help to alleviate any discomfort you are experiencing:  Avoid the use of tampons and douches as directed by your health care provider.  Change your pads frequently. You should get regular pelvic exams and Pap tests. Keep all follow-up appointments for diagnostic tests as directed by your health care provider.  SEEK MEDICAL CARE IF:   Your bleeding lasts more than 1 week.   You feel dizzy at times.  SEEK IMMEDIATE MEDICAL CARE IF:   You pass out.   You are changing pads every 15 to 30 minutes.   You have abdominal pain.  You have a fever.   You become sweaty or weak.   You are passing large blood clots from the vagina.   You start to feel nauseous and vomit. MAKE SURE YOU:   Understand these instructions.  Will watch your condition.  Will get help right away if you are not doing well or get worse. Document Released: 12/30/2004 Document Revised: 01/04/2013 Document Reviewed: 07/29/2012 Exit[employer] Patient Information 2015 [employer]. This information is not intended to replace advice given to you by your health care provider. Make sure you discuss any questions you have with your  health care provider.  

## 2013-12-30 ENCOUNTER — Telehealth: Payer: Self-pay | Admitting: Nurse Practitioner

## 2013-12-30 LAB — TSH: TSH: 3.511 u[IU]/mL (ref 0.350–4.500)

## 2013-12-30 MED ORDER — MEDROXYPROGESTERONE ACETATE 10 MG PO TABS: 10.0000 mg | ORAL_TABLET | Freq: Every day | ORAL | Status: DC

## 2013-12-30 NOTE — Telephone Encounter (Signed)
Lets give her Provera 10 mg for 10 days and this should coincide with next menses.

## 2013-12-30 NOTE — Telephone Encounter (Signed)
Pt says she is having some issues and was just in on yesterday. Please call to advise.

## 2013-12-30 NOTE — Telephone Encounter (Signed)
Called patient no answer. Detailed message left on mobile okay per designated party release form.  Advised to start Provera 10 mg po 1 tablet daily for 10 days. Should expect menses after stopping provera.  Advised patient to call back or seek immediate medical care if bleeding worsens or soaking through 1 pad/tampon per hour for two hours or if concerns can call on call doctor with emergencies.  Advised to call back on Monday morning to give update on bleeding.    Will cc Dr. Sabra Heck to message for initial message regarding annual exam with Dr. Sabra Heck.    Call to patient again before end of day and patient answered. She received message and understands instructions.  She will call back with any concerns.

## 2013-12-30 NOTE — Telephone Encounter (Signed)
Spoke with patient. She states bleeding has increased since office visit yesterday with Milford Cage, FNP. Pt states she is wearing panty liner only and has changed liner once today. Patient also reports headache.  Patient states she thought bleeding would go away. Patient is concerned that bleeding will increase over the weekend and would like instructions. Patient states she was advised she may need medication to stop bleeding.  Advised would send her request to Milford Cage, [place] and return her call with instructions.   Patient also requests to schedule annual exam with Dr. Sabra Heck and Dr. Sabra Heck only. No annual exams in electronic records. Patient given next open with Dr. Sabra Heck 02/2015 and highly encouraged to see NP or another provider in the interim as it is not recommended to wait so long in between annual exams. Offered annual exam for next week 01/02/14 with Regina Eck CNM and patient declines. Wishes to see Dr. Sabra Heck only. Wishes to go on wait list if possible and front office supervisor notified.

## 2014-01-01 NOTE — Progress Notes (Signed)
Encounter reviewed by Dr. Brook Silva.  

## 2014-01-02 NOTE — Telephone Encounter (Signed)
Please see how patient is doing with provera.  Did bleeding stop?  Feeling hormonal?  She is going to bleed again as soon as the Provera is done so probably needs different plan if goal is to keep from bleeding.

## 2014-01-03 NOTE — Telephone Encounter (Signed)
Message left to return call to Metta Koranda at [phone].    

## 2014-01-09 NOTE — Telephone Encounter (Signed)
Spoke with patient. She states bleeding has stopped.  No hormonal feelings. Has two tablets of provera left.  Advised will have bleeding when provera completed and this is to be expected. Offered annual exam with Dr. Sabra Heck for tomorrow 01/10/14 and patient declines, going out of town to [place]. She will keep appointments as scheduled.  Advised patient to call back with any further concerns. She is agreeable.   Routing to provider for final review. Patient agreeable to disposition. Will close encounter

## 2014-05-01 ENCOUNTER — Other Ambulatory Visit: Payer: BLUE CROSS/BLUE SHIELD

## 2014-05-01 ENCOUNTER — Encounter: Payer: Self-pay | Admitting: Internal Medicine

## 2014-05-01 ENCOUNTER — Ambulatory Visit (INDEPENDENT_AMBULATORY_CARE_PROVIDER_SITE_OTHER): Payer: BLUE CROSS/BLUE SHIELD | Admitting: Internal Medicine

## 2014-05-01 ENCOUNTER — Other Ambulatory Visit (INDEPENDENT_AMBULATORY_CARE_PROVIDER_SITE_OTHER): Payer: BLUE CROSS/BLUE SHIELD

## 2014-05-01 VITALS — BP 122/68 | HR 80 | Temp 98.4°F | Resp 16 | Wt 169.8 lb

## 2014-05-01 DIAGNOSIS — E890 Postprocedural hypothyroidism: Secondary | ICD-10-CM

## 2014-05-01 DIAGNOSIS — E039 Hypothyroidism, unspecified: Secondary | ICD-10-CM

## 2014-05-01 DIAGNOSIS — K439 Ventral hernia without obstruction or gangrene: Secondary | ICD-10-CM

## 2014-05-01 DIAGNOSIS — Z0000 Encounter for general adult medical examination without abnormal findings: Secondary | ICD-10-CM

## 2014-05-01 LAB — LIPID PANEL
Cholesterol: 191 mg/dL (ref 0–200)
HDL: 58.3 mg/dL (ref >39.00)
LDL Cholesterol: 109 mg/dL — ABNORMAL HIGH (ref 0–99)
NONHDL: 132.7
Total CHOL/HDL Ratio: 3
Triglycerides: 121 mg/dL (ref 0.0–149.0)
VLDL: 24.2 mg/dL (ref 0.0–40.0)

## 2014-05-01 LAB — BASIC METABOLIC PANEL
BUN: 14 mg/dL (ref 6–23)
CALCIUM: 9 mg/dL (ref 8.4–10.5)
CHLORIDE: 105 meq/L (ref 96–112)
CO2: 26 mEq/L (ref 19–32)
Creatinine, Ser: 0.67 mg/dL (ref 0.40–1.20)
GFR: 99.25 mL/min (ref >60.00)
Glucose, Bld: 80 mg/dL (ref 70–99)
Potassium: 3.7 mEq/L (ref 3.5–5.1)
Sodium: 138 mEq/L (ref 135–145)

## 2014-05-01 LAB — TSH: TSH: 4.78 u[IU]/mL — ABNORMAL HIGH (ref 0.35–4.50)

## 2014-05-01 NOTE — Assessment & Plan Note (Signed)
Sees Ob/Gyn for mammogram and pap smears. Does not take flu shots. Declines tetanus today and will ask again next year. Talked to her about routine exercise. Non-smoker. Given screening and health maintenance suggestions.

## 2014-05-01 NOTE — Progress Notes (Signed)
Pre visit review using our clinic review tool, if applicable. No additional management support is needed unless otherwise documented below in the visit note. 

## 2014-05-01 NOTE — Assessment & Plan Note (Signed)
Referral to surgery to discuss. Talked to her about avoiding straining and coughing. Talked to her about risks of future hernias with fixing. Easily reducible on exam.

## 2014-05-01 NOTE — Patient Instructions (Signed)
We will have you go talk to the surgeon who can get the test for the stomach. We are checking on the blood work today and will call you back with the results.  Ventral Hernia A ventral hernia (also called an incisional hernia) is a hernia that occurs at the site of a previous surgical cut (incision) in the abdomen. The abdominal wall spans from your lower chest down to your pelvis. If the abdominal wall is weakened from a surgical incision, a hernia can occur. A hernia is a bulge of bowel or muscle tissue pushing out on the weakened part of the abdominal wall. Ventral hernias can get bigger from straining or lifting. Obese and older people are at higher risk for a ventral hernia. People who develop infections after surgery or require repeat incisions at the same site on the abdomen are also at increased risk. CAUSES  A ventral hernia occurs because of weakness in the abdominal wall at an incision site.  SYMPTOMS  Common symptoms include:  A visible bulge or lump on the abdominal wall.  Pain or tenderness around the lump.  Increased discomfort if you cough or make a sudden movement. If the hernia has blocked part of the intestine, a serious complication can occur (incarcerated or strangulated hernia). This can become a problem that requires emergency surgery because the blood flow to the blocked intestine may be cut off. Symptoms may include:  Feeling sick to your stomach (nauseous).  Throwing up (vomiting).  Stomach swelling (distention) or bloating.  Fever.  Rapid heartbeat. DIAGNOSIS  Your health care provider will take a medical history and perform a physical exam. Various tests may be ordered, such as:  Blood tests.  Urine tests.  Ultrasonography.  X-rays.  Computed tomography (CT). TREATMENT  Watchful waiting may be all that is needed for a smaller hernia that does not cause symptoms. Your health care provider may recommend the use of a supportive belt (truss) that helps  to keep the abdominal wall intact. For larger hernias or those that cause pain, surgery to repair the hernia is usually recommended. If a hernia becomes strangulated, emergency surgery needs to be done right away. HOME CARE INSTRUCTIONS  Avoid putting pressure or strain on the abdominal area.  Avoid heavy lifting.  Use good body positioning for physical tasks. Ask your health care provider about proper body positioning.  Use a supportive belt as directed by your health care provider.  Maintain a healthy weight.  Eat foods that are high in fiber, such as whole grains, fruits, and vegetables. Fiber helps prevent difficult bowel movements (constipation).  Drink enough fluids to keep your urine clear or pale yellow.  Follow up with your health care provider as directed. SEEK MEDICAL CARE IF:   Your hernia seems to be getting larger or more painful. SEEK IMMEDIATE MEDICAL CARE IF:   You have abdominal pain that is sudden and sharp.  Your pain becomes severe.  You have repeated vomiting.  You are sweating a lot.  You notice a rapid heartbeat.  You develop a fever. MAKE SURE YOU:   Understand these instructions.  Will watch your condition.  Will get help right away if you are not doing well or get worse. Document Released: 12/17/2011 Document Revised: 05/16/2013 Document Reviewed: 12/17/2011 [facility] Patient Information 2015 [place], [place]. This information is not intended to replace advice given to you by your health care provider. Make sure you discuss any questions you have with your health care provider.

## 2014-05-01 NOTE — Assessment & Plan Note (Signed)
Checking thyroid function today, continue 112 mcg synthroid and adjust as needed.

## 2014-05-01 NOTE — Progress Notes (Signed)
   Subjective:    Patient ID: Kathryn Gregory, female    DOB: 09/10/1965, 49 y.o.   MRN: [id number]  HPI The patient is a 49 YO female who is coming in for wellness. She has one new complaint which is some bulging in the right side of her stomach which started when she had a large sneeze. It bulges more when she coughs or strains.   PMH, [facility], social history reviewed and updated with the patient.   Review of Systems  Constitutional: Negative for fever, activity change, appetite change, fatigue and unexpected weight change.  HENT: Negative.   Eyes: Negative.   Respiratory: Negative for cough, chest tightness, shortness of breath and wheezing.   Cardiovascular: Negative for chest pain, palpitations and leg swelling.  Gastrointestinal: Positive for abdominal distention. Negative for nausea, abdominal pain, diarrhea and constipation.  Musculoskeletal: Negative.   Skin: Negative.   Neurological: Negative.   Psychiatric/Behavioral: Negative.       Objective:   Physical Exam  Constitutional: She is oriented to person, place, and time. She appears well-developed and well-nourished.  HENT:  Head: Normocephalic and atraumatic.  Eyes: EOM are normal.  Neck: Normal range of motion.  Cardiovascular: Normal rate and regular rhythm.   Pulmonary/Chest: Effort normal and breath sounds normal.  Abdominal: Soft. Bowel sounds are normal. She exhibits distension.  Minimal bulge superior to the umbilicus and right  Musculoskeletal: She exhibits no edema.  Neurological: She is alert and oriented to person, place, and time.  Skin: Skin is warm and dry.  Psychiatric: She has a normal mood and affect.   Filed Vitals:   05/01/14 0935  BP: 122/68  Pulse: 80  Temp: 98.4 F (36.9 C)  TempSrc: Oral  Resp: 16  Weight: 169 lb 12.8 oz (77.021 kg)  SpO2: 97%      Assessment & Plan:

## 2014-05-17 ENCOUNTER — Other Ambulatory Visit: Payer: Self-pay | Admitting: General Surgery

## 2014-05-17 DIAGNOSIS — K439 Ventral hernia without obstruction or gangrene: Secondary | ICD-10-CM

## 2014-05-23 ENCOUNTER — Other Ambulatory Visit: Payer: BLUE CROSS/BLUE SHIELD

## 2014-06-02 ENCOUNTER — Ambulatory Visit
Admission: RE | Admit: 2014-06-02 | Discharge: 2014-06-02 | Disposition: A | Discharge status: Home / Self Care | Payer: BLUE CROSS/BLUE SHIELD | Source: Ambulatory Visit | Attending: General Surgery | Admitting: General Surgery

## 2014-06-02 DIAGNOSIS — K439 Ventral hernia without obstruction or gangrene: Secondary | ICD-10-CM

## 2014-06-02 MED ORDER — IOPAMIDOL (ISOVUE-300) INJECTION 61%
100.0000 mL | Freq: Once | INTRAVENOUS | Status: AC | PRN
  Administered 2014-06-02: 100 mL via INTRAVENOUS

## 2014-06-21 ENCOUNTER — Encounter: Payer: BC Managed Care – PPO | Admitting: Internal Medicine

## 2014-09-11 ENCOUNTER — Other Ambulatory Visit: Payer: Self-pay | Admitting: Internal Medicine

## 2015-01-25 ENCOUNTER — Ambulatory Visit (INDEPENDENT_AMBULATORY_CARE_PROVIDER_SITE_OTHER): Payer: BLUE CROSS/BLUE SHIELD | Admitting: Obstetrics & Gynecology

## 2015-01-25 ENCOUNTER — Encounter: Payer: Self-pay | Admitting: Obstetrics & Gynecology

## 2015-01-25 VITALS — BP 114/66 | HR 80 | Ht 66.25 in | Wt 167.0 lb

## 2015-01-25 DIAGNOSIS — Z01419 Encounter for gynecological examination (general) (routine) without abnormal findings: Secondary | ICD-10-CM

## 2015-01-25 DIAGNOSIS — Z1211 Encounter for screening for malignant neoplasm of colon: Secondary | ICD-10-CM | POA: Diagnosis not present

## 2015-01-25 DIAGNOSIS — Z124 Encounter for screening for malignant neoplasm of cervix: Secondary | ICD-10-CM | POA: Diagnosis not present

## 2015-01-25 DIAGNOSIS — Z1239 Encounter for other screening for malignant neoplasm of breast: Secondary | ICD-10-CM | POA: Diagnosis not present

## 2015-01-25 DIAGNOSIS — Z0000 Encounter for general adult medical examination without abnormal findings: Secondary | ICD-10-CM | POA: Diagnosis not present

## 2015-01-25 LAB — POCT URINALYSIS DIPSTICK
Bilirubin, UA: NEGATIVE
Blood, UA: NEGATIVE
GLUCOSE UA: NEGATIVE
Ketones, UA: NEGATIVE
Leukocytes, UA: NEGATIVE
NITRITE UA: NEGATIVE
Protein, UA: NEGATIVE
Urobilinogen, UA: NEGATIVE
pH, UA: 5

## 2015-01-25 NOTE — Progress Notes (Signed)
50 y.o. G50P2002 Married [place] F here for annual exam.  Doing well.  Still having menstrual cycles.  Reports she's skipped a couple of months.  Flow is very heavy on second day of bleeding.  Cycles typically last five days all total.  Typically passes clots and will bleed through tampons.  Changes products every two hours.  Reports last two months have been much better.    Thyroid function has been a little elevated.  She is being followed by PCP for this.    Having some issues with her back.  Has been seen at [place].  Has seen a PA there within the last year.    Has been >3 years since I've seen pt.   Patient's last menstrual period was 01/24/2015 (exact date).          Sexually active: Yes.    The current method of family planning is tubal ligation.    Exercising: No.  The patient does not participate in regular exercise at present. Smoker:  Former smoker  Health Maintenance: Pap:  04/09/11, negative with neg HR HPV History of abnormal Pap:  no MMG:  2010 Colonoscopy:  ?1990 due to diarrhea, normal BMD:   Never TDaP:  2006 Screening Labs: PCP in EPIC, Hb today: PCP, Urine today: Negative    reports that she has quit smoking. She has never used smokeless tobacco. She reports that she drinks about 0.6 oz of alcohol per week. She reports that she does not use illicit drugs.  Past Medical History  Diagnosis Date  . Rosacea   . IBS (irritable bowel syndrome)   . ANEMIA-NOS   . GERD   . Other postablative hypothyroidism 07/2009    I-131ablation 07/2009 for hyperthyroid    Past Surgical History  Procedure Laterality Date  . Tubal ligation  1998  . Tonsillectomy      Current Outpatient Prescriptions  Medication Sig Dispense Refill  . ciclopirox (PENLAC) 8 % solution APPLY TO NAIL(S) AT BEDTIME 6.6 mL 1  . levothyroxine (SYNTHROID, LEVOTHROID) 112 MCG tablet Take 1 tablet (112 mcg total) by mouth daily. 90 tablet 2  . MAGNESIUM PO Take by mouth.     No current  facility-administered medications for this visit.    Family History  Problem Relation Age of Onset  . Mental illness Mother   . Lung disease Mother     lung fibrosis  . Breast cancer Maternal Aunt   . Colon cancer Paternal Uncle   . Dementia Maternal Grandmother   . Breast cancer Cousin 18  . Cancer Other     cancer of tongue  . Diabetes Father 74  . Multiple births Sister     3 sets of twins  . Cancer Maternal Uncle     unsure type  . Diabetes Paternal Grandmother     ROS:  Pertinent items are noted in HPI.  Otherwise, a comprehensive ROS was negative.  Exam:   BP 114/66 mmHg  Pulse 80  Ht 5' 6.25" (1.683 m)  Wt 167 lb (75.751 kg)  BMI 26.74 kg/m2  LMP 01/24/2015 (Exact Date)     Height: 5' 6.25" (168.3 cm)  Ht Readings from Last 3 Encounters:  01/25/15 5' 6.25" (1.683 m)  12/29/13 5' 6.25" (1.683 m)  06/16/13 5\' 6"  (1.676 m)    General appearance: alert, cooperative and appears stated age Head: Normocephalic, without obvious abnormality, atraumatic Neck: no adenopathy, supple, symmetrical, trachea midline and thyroid normal to inspection and palpation Lungs: clear  to auscultation bilaterally Breasts: normal appearance, no masses or tenderness Heart: regular rate and rhythm Abdomen: soft, non-tender; bowel sounds normal; no masses,  no organomegaly Extremities: extremities normal, atraumatic, no cyanosis or edema Skin: Skin color, texture, turgor normal. No rashes or lesions Lymph nodes: Cervical, supraclavicular, and axillary nodes normal. No abnormal inguinal nodes palpated Neurologic: Grossly normal   Pelvic: External genitalia:  no lesions              Urethra:  normal appearing urethra with no masses, tenderness or lesions              Bartholins and Skenes: normal                 Vagina: normal appearing vagina with normal color and discharge, no lesions              Cervix: no lesions              Pap taken: Yes.   Bimanual Exam:  Uterus:  normal size,  contour, position, consistency, mobility, non-tender              Adnexa: normal adnexa and no mass, fullness, tenderness               Rectovaginal: DECLINES               Anus:  no lesions  Chaperone was present for exam.  A:  Well Woman with normal exam H/O menorrhagia that has improved over the past couple of months, declines any treatment or evaluation Former smoker H/O anemia H/O IBS H/O hypothyroidism after thyroid ablation H/O breast cancer in maternal aunt  P:   Mammogram appt made for pt while she was in the office.  Aware overdue Pap smear with HR HPV testing obtained today Lab work was done with Dr. Asa Lente 4/16 Referral to Dr. Collene Mares for colonoscopy return annually or prn

## 2015-01-25 NOTE — Patient Instructions (Signed)
Dr. Colin Benton, [place] at [facility]

## 2015-01-28 ENCOUNTER — Encounter: Payer: Self-pay | Admitting: Obstetrics & Gynecology

## 2015-01-29 LAB — IPS PAP TEST WITH HPV

## 2015-02-07 ENCOUNTER — Ambulatory Visit
Admission: RE | Admit: 2015-02-07 | Discharge: 2015-02-07 | Disposition: A | Discharge status: Home / Self Care | Payer: BLUE CROSS/BLUE SHIELD | Source: Ambulatory Visit | Attending: Obstetrics & Gynecology | Admitting: Obstetrics & Gynecology

## 2015-02-07 DIAGNOSIS — Z1239 Encounter for other screening for malignant neoplasm of breast: Secondary | ICD-10-CM

## 2015-02-12 ENCOUNTER — Other Ambulatory Visit: Payer: Self-pay | Admitting: Obstetrics & Gynecology

## 2015-02-12 DIAGNOSIS — R928 Other abnormal and inconclusive findings on diagnostic imaging of breast: Secondary | ICD-10-CM

## 2015-02-14 ENCOUNTER — Ambulatory Visit
Admission: RE | Admit: 2015-02-14 | Discharge: 2015-02-14 | Disposition: A | Discharge status: Home / Self Care | Payer: BLUE CROSS/BLUE SHIELD | Source: Ambulatory Visit | Attending: Obstetrics & Gynecology | Admitting: Obstetrics & Gynecology

## 2015-02-14 ENCOUNTER — Other Ambulatory Visit: Payer: Self-pay | Admitting: Obstetrics & Gynecology

## 2015-02-14 DIAGNOSIS — R928 Other abnormal and inconclusive findings on diagnostic imaging of breast: Secondary | ICD-10-CM

## 2015-02-14 DIAGNOSIS — N6320 Unspecified lump in the left breast, unspecified quadrant: Secondary | ICD-10-CM

## 2015-02-16 ENCOUNTER — Other Ambulatory Visit: Payer: Self-pay | Admitting: Obstetrics & Gynecology

## 2015-02-16 DIAGNOSIS — N6320 Unspecified lump in the left breast, unspecified quadrant: Secondary | ICD-10-CM

## 2015-02-19 ENCOUNTER — Ambulatory Visit
Admission: RE | Admit: 2015-02-19 | Discharge: 2015-02-19 | Disposition: A | Discharge status: Home / Self Care | Payer: BLUE CROSS/BLUE SHIELD | Source: Ambulatory Visit | Attending: Obstetrics & Gynecology | Admitting: Obstetrics & Gynecology

## 2015-02-19 DIAGNOSIS — N6320 Unspecified lump in the left breast, unspecified quadrant: Secondary | ICD-10-CM

## 2015-03-06 ENCOUNTER — Ambulatory Visit: Payer: BC Managed Care – PPO | Admitting: Obstetrics & Gynecology

## 2015-03-27 ENCOUNTER — Other Ambulatory Visit: Payer: Self-pay | Admitting: Gastroenterology

## 2015-04-25 ENCOUNTER — Encounter (HOSPITAL_COMMUNITY): Payer: Self-pay | Admitting: *Deleted

## 2015-05-03 ENCOUNTER — Encounter: Payer: BLUE CROSS/BLUE SHIELD | Admitting: Internal Medicine

## 2015-05-03 NOTE — H&P (Signed)
  Kathryn Gregory HPI: This 50 year old [place] female presents to the office for a follow up. She had an EGD done on 02/14/2015 which revealed a large polypoid mass in overlying ulcer found in the gastric antrum, an small hiatal hernia and normal duodenum, biopsies of the stomach/ antrum revealed reactive gastropathic changes and erosion without evidence of H. Pylori identified. A colonoscopy was done at the same time when a tubular adenoma was removed from the sigmoid colon. She has 4-5 BM's per day with no obvious blood or mucus in the stool. She has occasional constipation as well. She has good appetite and her weight is stable. She denies any complaints of abdominal pain, nausea, vomiting, acid reflux, dysphagia or odynophagia. She denies having a family history of colon cancer, celiac sprue, or IBD.   Past Medical History  Diagnosis Date  . Rosacea   . IBS (irritable bowel syndrome)   . ANEMIA-NOS   . GERD   . Other postablative hypothyroidism 07/2009    I-131ablation 07/2009 for hyperthyroid  . Lumbar herniated disc     not a problem now    Past Surgical History  Procedure Laterality Date  . Tubal ligation  1998  . Tonsillectomy      Family History  Problem Relation Age of Onset  . Mental illness Mother   . Lung disease Mother     lung fibrosis  . Breast cancer Maternal Aunt   . Colon cancer Paternal Uncle   . Dementia Maternal Grandmother   . Breast cancer Cousin 69  . Cancer Other     cancer of tongue  . Diabetes Father 44  . Multiple births Sister     3 sets of twins  . Cancer Maternal Uncle     unsure type  . Diabetes Paternal Grandmother     Social History:  reports that she has quit smoking. She quit smokeless tobacco use about 27 years ago. She reports that she drinks about 0.6 oz of alcohol per week. She reports that she does not use illicit drugs.  Allergies:  Allergies  Allergen Reactions  . Penicillins     Childhood allergy-unknown.  Has patient had a  PCN reaction causing immediate rash, facial/tongue/throat swelling, SOB or lightheadedness with hypotension: No Has patient had a PCN reaction causing severe rash involving mucus membranes or skin necrosis: No Has patient had a PCN reaction that required hospitalization No Has patient had a PCN reaction occurring within the last 10 years: No If all of the above answers are "NO", then may procee    Medications: Scheduled: Continuous:  No results found for this or any previous visit (from the past 24 hour(s)).   No results found.  ROS:  As stated above in the HPI otherwise negative.  Last menstrual period 04/02/2015.    PE: Gen: NAD, Alert and Oriented HEENT:  [place]/AT, EOMI Neck: Supple, no LAD Lungs: CTA Bilaterally CV: RRR without M/G/R ABM: Soft, NTND, +BS Ext: No C/C/E  Assessment/Plan: 1) Antral polypoid lesion - EUS with FNA.  Kathryn Gregory D 05/03/2015, 12:31 PM

## 2015-05-04 ENCOUNTER — Encounter (HOSPITAL_COMMUNITY)
Admission: RE | Disposition: A | Discharge status: Home / Self Care | Payer: Self-pay | Source: Ambulatory Visit | Attending: Gastroenterology

## 2015-05-04 ENCOUNTER — Encounter (HOSPITAL_COMMUNITY): Payer: Self-pay

## 2015-05-04 ENCOUNTER — Ambulatory Visit (HOSPITAL_COMMUNITY)
Admission: RE | Admit: 2015-05-04 | Discharge: 2015-05-04 | Disposition: A | Discharge status: Home / Self Care | Payer: BLUE CROSS/BLUE SHIELD | Source: Ambulatory Visit | Attending: Gastroenterology | Admitting: Gastroenterology

## 2015-05-04 ENCOUNTER — Ambulatory Visit (HOSPITAL_COMMUNITY): Payer: BLUE CROSS/BLUE SHIELD | Admitting: Anesthesiology

## 2015-05-04 DIAGNOSIS — K317 Polyp of stomach and duodenum: Secondary | ICD-10-CM | POA: Insufficient documentation

## 2015-05-04 DIAGNOSIS — Z86010 Personal history of colonic polyps: Secondary | ICD-10-CM | POA: Diagnosis not present

## 2015-05-04 DIAGNOSIS — Z87891 Personal history of nicotine dependence: Secondary | ICD-10-CM | POA: Diagnosis not present

## 2015-05-04 DIAGNOSIS — D509 Iron deficiency anemia, unspecified: Secondary | ICD-10-CM | POA: Diagnosis not present

## 2015-05-04 DIAGNOSIS — Z800 Family history of malignant neoplasm of digestive organs: Secondary | ICD-10-CM | POA: Insufficient documentation

## 2015-05-04 DIAGNOSIS — K3189 Other diseases of stomach and duodenum: Secondary | ICD-10-CM | POA: Insufficient documentation

## 2015-05-04 HISTORY — DX: Other intervertebral disc displacement, lumbar region: M51.26

## 2015-05-04 HISTORY — PX: EUS: SHX5427

## 2015-05-04 SURGERY — UPPER ENDOSCOPIC ULTRASOUND (EUS) LINEAR
Anesthesia: Monitor Anesthesia Care

## 2015-05-04 MED ORDER — LACTATED RINGERS IV SOLN
INTRAVENOUS | Status: DC
  Administered 2015-05-04: 1000 mL via INTRAVENOUS
  Administered 2015-05-04: 09:00:00 via INTRAVENOUS

## 2015-05-04 MED ORDER — PROPOFOL 500 MG/50ML IV EMUL
INTRAVENOUS | Status: DC | PRN
  Administered 2015-05-04: 150 ug/kg/min via INTRAVENOUS

## 2015-05-04 MED ORDER — PROPOFOL 500 MG/50ML IV EMUL
INTRAVENOUS | Status: DC | PRN
  Administered 2015-05-04: 40 mg via INTRAVENOUS

## 2015-05-04 MED ORDER — SODIUM CHLORIDE 0.9 % IV SOLN: INTRAVENOUS | Status: DC

## 2015-05-04 MED ORDER — PROPOFOL 10 MG/ML IV BOLUS
INTRAVENOUS | Status: AC
  Filled 2015-05-04: qty 20

## 2015-05-04 NOTE — Op Note (Signed)
[facility] Patient Name: Kathryn Gregory Procedure Date: 05/04/2015 MRN: [id number] Attending MD: Carol Ada , MD Date of Birth: [birth year] CSN:  Age: 50 Admit Type: Outpatient Procedure:                Upper EUS Indications:              Gastric mucosal mass/polyp found on endoscopy Providers:                Carol Ada, MD, Dortha Schwalbe, RN, Elspeth Cho, Technician Referring MD:              Medicines:                Propofol per Anesthesia Complications:            No immediate complications. Estimated Blood Loss:     Estimated blood loss: none. Procedure:                Pre-Anesthesia Assessment:                           - Prior to the procedure, a History and Physical                            was performed, and patient medications and                            allergies were reviewed. The patient's tolerance of                            previous anesthesia was also reviewed. The risks                            and benefits of the procedure and the sedation                            options and risks were discussed with the patient.                            All questions were answered, and informed consent                            was obtained. Prior Anticoagulants: The patient has                            taken no previous anticoagulant or antiplatelet                            agents. ASA Grade Assessment: II - A patient with                            mild systemic disease. After reviewing the risks  and benefits, the patient was deemed in                            satisfactory condition to undergo the procedure.                           - Sedation was administered by an anesthesia                            professional. Deep sedation was attained.                           After obtaining informed consent, the endoscope was                            passed under direct vision.  Throughout the                            procedure, the patient's blood pressure, pulse, and                            oxygen saturations were monitored continuously. The                            [id number] scope was introduced through                            the mouth, and advanced to the duodenal bulb. The                            upper EUS was technically difficult and complex due                            to poor endoscopic visualization. The patient                            tolerated the procedure well. Scope In: Scope Out: Findings:      Endoscopic Finding :      The examined esophagus was endoscopically normal.      A large amount of food (residue) was found in the gastric body and in       the gastric antrum.      A single 10 mm pedunculated and sessile polyp with no bleeding and no       stigmata of recent bleeding was found in the gastric antrum.      Endosonographic Finding :      A hypoechoic round mass was identified endosonographically in the antrum       of the stomach. This lesion measured approximately 1 cm. Because of the       large food residue, further examination and manipulation of the lesion       was not possible.[First dimension] [Second dimension]. [Margin].       [Invasion Into]. [Lack of Invasion]. Impression:               - Normal esophagus.                           -  A large amount of food (residue) in the stomach.                           - A single gastric polyp.                           - No specimens collected. Moderate Sedation:      N/A- Per Anesthesia Care Recommendation:           - Reschedule for EGD.                           - Clear liquid diet the day before the procedure.                           - Patient has a contact number available for                            emergencies. The signs and symptoms of potential                            delayed complications were discussed with the                             patient. Return to normal activities tomorrow.                            Written discharge instructions were provided to the                            patient.                           - Resume previous diet. Procedure Code(s):        --- Professional ---                           [phone], Esophagogastroduodenoscopy, flexible,                            transoral; with endoscopic ultrasound examination                            limited to the esophagus, stomach or duodenum, and                            adjacent structures Diagnosis Code(s):        --- Professional ---                           K31.7, Polyp of stomach and duodenum                           K31.89, Other diseases of stomach and duodenum CPT copyright 2016 [employer]. All rights reserved. The codes documented in this report are preliminary and upon coder review may  be revised to meet current  compliance requirements. Carol Ada, MD Carol Ada, MD 05/04/2015 9:33:31 AM This report has been signed electronically. Number of Addenda: 0

## 2015-05-04 NOTE — Discharge Instructions (Signed)
,   Care After Refer to this sheet in the next few weeks. These instructions provide you with information on caring for yourself after your procedure. Your health care provider may also give you more specific instructions. Your treatment has been planned according to current medical practices, but problems sometimes occur. Call your health care provider if you have any problems or questions after your procedure.  HOME CARE INSTRUCTIONS During the Test Period  Only take over-the-counter or prescription medications as directed by your health care provider. You may be asked to avoid certain medications during the test period (usually 24 hours).  Perform your normal daily activities during the test period.  Stay upright throughout the day, unless you typically take naps.  Eat as you normally would. Be sure to eat 2-3 meals per day. If you change your diet or do not eat, this will affect the test results because your stomach will not produce the usual amount of acid.  Include foods in your meals that tend to make your symptoms worse (within moderation). This will help ensure that the test provides information on how your stomach responds to acid.  Avoid snacking, chewing gum, and sucking on lozenges or candies.  Keep the recording device on your belt or in your pocket as directed by your health care provider.  Do not allow the recording device to get wet. Follow your health care provider's instructions for bathing. You may be able to take a shower or tub bath if you place the recorder in a dry spot next to the shower or tub.  Keep records of your daily diet and activity as directed by your health care provider. You will be asked to record data such as when and what you eat, and when you lie down and get up.  Press the buttons on the recorder when you have symptoms as directed by your health care provider. After the Test Period  Resume your regular medications as directed by your health care  provider.  Return the recording device and diary to your health care provider as directed.  The capsule device will loosen on its own and be eliminated in a bowel movement in 7-10 days.  Avoid MRI equipment for 30 days after the procedure.  Follow up with your health care provider as directed to discuss the results of the testing. SEEK MEDICAL CARE IF:  You are having trouble keeping records or using the recorder.  You have any new symptoms. SEEK IMMEDIATE MEDICAL CARE IF:  You develop a fever.  You have difficulty swallowing.  You have chest pain.  You have increasing pain in the throat or abdomen.  You develop nausea or vomiting.  You feel bloating in the abdomen. MAKE SURE YOU:  Understand these instructions.  Will watch your condition.  Will get help right away if you are not doing well or get worse.   This information is not intended to replace advice given to you by your health care provider. Make sure you discuss any questions you have with your health care provider.   Document Released: 09/01/2012 Document Revised: 01/20/2014 Document Reviewed: 09/01/2012 Elsevier Interactive Patient Education [employer].

## 2015-05-04 NOTE — Anesthesia Preprocedure Evaluation (Addendum)
Anesthesia Evaluation  Patient identified by MRN, date of birth, ID band Patient awake    Reviewed: Allergy & Precautions, NPO status , Patient's Chart, lab work & pertinent test results  Airway Mallampati: II  TM Distance: >3 FB Neck ROM: Full    Dental no notable dental hx.    Pulmonary former smoker,    Pulmonary exam normal breath sounds clear to auscultation       Cardiovascular negative cardio ROS Normal cardiovascular exam Rhythm:Regular Rate:Normal     Neuro/Psych negative neurological ROS  negative psych ROS   GI/Hepatic Neg liver ROS, GERD  ,  Endo/Other  Hypothyroidism   Renal/GU negative Renal ROS  negative genitourinary   Musculoskeletal negative musculoskeletal ROS (+)   Abdominal   Peds negative pediatric ROS (+)  Hematology  (+) anemia ,   Anesthesia Other Findings   Reproductive/Obstetrics negative OB ROS                             Anesthesia Physical Anesthesia Plan  ASA: II  Anesthesia Plan: MAC   Post-op Pain Management:    Induction: Intravenous  Airway Management Planned: Natural Airway  Additional Equipment:   Intra-op Plan:   Post-operative Plan:   Informed Consent: I have reviewed the patients History and Physical, chart, labs and discussed the procedure including the risks, benefits and alternatives for the proposed anesthesia with the patient or authorized representative who has indicated his/her understanding and acceptance.   Dental advisory given  Plan Discussed with: CRNA  Anesthesia Plan Comments:         Anesthesia Quick Evaluation

## 2015-05-04 NOTE — Progress Notes (Signed)
Dr. Eliseo Squires made aware that Pts blood pressure is in the 90's. Pt denies symptoms. Mean is above 60. Pt 's preop bp was in low [id number] systolicly.  NO new orders pt is fine to go home per Dr. Eliseo Squires

## 2015-05-04 NOTE — Progress Notes (Deleted)
Pt  States he feels better at this time and wants to swallow the Norvasc. Norvasc given per order. Will monitor patient to make sure he feels better.   Pt states pain is easing off.

## 2015-05-04 NOTE — Transfer of Care (Signed)
Immediate Anesthesia Transfer of Care Note  Patient: Kathryn Gregory  Procedure(s) Performed: Procedure(s): UPPER ENDOSCOPIC ULTRASOUND (EUS) LINEAR (N/A)  Patient Location: PACU and Endoscopy Unit  Anesthesia Type:MAC  Level of Consciousness: awake, alert  and patient cooperative  Airway & Oxygen Therapy: Patient Spontanous Breathing and Patient connected to nasal cannula oxygen  Post-op Assessment: Report given to RN and Post -op Vital signs reviewed and stable  Post vital signs: Reviewed and stable  Last Vitals:  Filed Vitals:   05/04/15 0807  BP: 108/63  Pulse: 74  Temp: 36.8 C  Resp: 14    Complications: No apparent anesthesia complications

## 2015-05-04 NOTE — Progress Notes (Deleted)
Dr. Eliseo Squires at bedside. Pt c/o pain and nausea. Zoran was just given.  Dr. Benson Norway at bedside to see pt about pain. Order for 25mg  of Fentanyl.  Will continue to assess.

## 2015-05-04 NOTE — Anesthesia Postprocedure Evaluation (Signed)
Anesthesia Post Note  Patient: Elsia Wolfrey Pevehouse  Procedure(s) Performed: Procedure(s) (LRB): UPPER ENDOSCOPIC ULTRASOUND (EUS) LINEAR (N/A)  Patient location during evaluation: PACU Anesthesia Type: MAC Level of consciousness: awake and alert Pain management: pain level controlled Vital Signs Assessment: post-procedure vital signs reviewed and stable Respiratory status: spontaneous breathing, nonlabored ventilation, respiratory function stable and patient connected to nasal cannula oxygen Cardiovascular status: stable and blood pressure returned to baseline Anesthetic complications: no    Last Vitals:  Filed Vitals:   05/04/15 1000 05/04/15 1005  BP: 94/54 98/47  Pulse: 65 65  Temp:    Resp: 19 10    Last Pain: There were no vitals filed for this visit.               Floris Neuhaus J

## 2015-05-07 ENCOUNTER — Other Ambulatory Visit: Payer: Self-pay | Admitting: Gastroenterology

## 2015-05-08 ENCOUNTER — Encounter (HOSPITAL_COMMUNITY): Payer: Self-pay | Admitting: Gastroenterology

## 2015-06-07 ENCOUNTER — Encounter (HOSPITAL_COMMUNITY): Payer: Self-pay | Admitting: *Deleted

## 2015-06-15 ENCOUNTER — Ambulatory Visit (HOSPITAL_COMMUNITY): Payer: BLUE CROSS/BLUE SHIELD | Admitting: Anesthesiology

## 2015-06-15 ENCOUNTER — Encounter (HOSPITAL_COMMUNITY): Payer: Self-pay | Admitting: Anesthesiology

## 2015-06-15 ENCOUNTER — Encounter (HOSPITAL_COMMUNITY)
Admission: RE | Disposition: A | Discharge status: Home / Self Care | Payer: Self-pay | Source: Ambulatory Visit | Attending: Gastroenterology

## 2015-06-15 ENCOUNTER — Ambulatory Visit (HOSPITAL_COMMUNITY)
Admission: RE | Admit: 2015-06-15 | Discharge: 2015-06-15 | Disposition: A | Discharge status: Home / Self Care | Payer: BLUE CROSS/BLUE SHIELD | Source: Ambulatory Visit | Attending: Gastroenterology | Admitting: Gastroenterology

## 2015-06-15 DIAGNOSIS — Z880 Allergy status to penicillin: Secondary | ICD-10-CM | POA: Diagnosis not present

## 2015-06-15 DIAGNOSIS — Z800 Family history of malignant neoplasm of digestive organs: Secondary | ICD-10-CM | POA: Insufficient documentation

## 2015-06-15 DIAGNOSIS — K589 Irritable bowel syndrome without diarrhea: Secondary | ICD-10-CM | POA: Diagnosis not present

## 2015-06-15 DIAGNOSIS — E890 Postprocedural hypothyroidism: Secondary | ICD-10-CM | POA: Insufficient documentation

## 2015-06-15 DIAGNOSIS — Z79899 Other long term (current) drug therapy: Secondary | ICD-10-CM | POA: Diagnosis not present

## 2015-06-15 DIAGNOSIS — Z87891 Personal history of nicotine dependence: Secondary | ICD-10-CM | POA: Diagnosis not present

## 2015-06-15 DIAGNOSIS — K219 Gastro-esophageal reflux disease without esophagitis: Secondary | ICD-10-CM | POA: Insufficient documentation

## 2015-06-15 DIAGNOSIS — K317 Polyp of stomach and duodenum: Secondary | ICD-10-CM | POA: Diagnosis not present

## 2015-06-15 HISTORY — PX: EUS: SHX5427

## 2015-06-15 SURGERY — UPPER ENDOSCOPIC ULTRASOUND (EUS) LINEAR
Anesthesia: Monitor Anesthesia Care

## 2015-06-15 MED ORDER — PROPOFOL 500 MG/50ML IV EMUL
INTRAVENOUS | Status: DC | PRN
  Administered 2015-06-15: 120 ug/kg/min via INTRAVENOUS

## 2015-06-15 MED ORDER — PROPOFOL 10 MG/ML IV BOLUS
INTRAVENOUS | Status: AC
  Filled 2015-06-15: qty 60

## 2015-06-15 MED ORDER — LIDOCAINE HCL (CARDIAC) 20 MG/ML IV SOLN
INTRAVENOUS | Status: AC
  Filled 2015-06-15: qty 5

## 2015-06-15 MED ORDER — SODIUM CHLORIDE 0.9 % IV SOLN: INTRAVENOUS | Status: DC

## 2015-06-15 MED ORDER — PROPOFOL 10 MG/ML IV BOLUS
INTRAVENOUS | Status: DC | PRN
  Administered 2015-06-15: 20 mg via INTRAVENOUS
  Administered 2015-06-15: 30 mg via INTRAVENOUS

## 2015-06-15 MED ORDER — LACTATED RINGERS IV SOLN
INTRAVENOUS | Status: DC | PRN
  Administered 2015-06-15: 08:00:00 via INTRAVENOUS

## 2015-06-15 MED ORDER — LIDOCAINE HCL (CARDIAC) 20 MG/ML IV SOLN
INTRAVENOUS | Status: DC | PRN
  Administered 2015-06-15: 25 mg via INTRATRACHEAL

## 2015-06-15 NOTE — Anesthesia Preprocedure Evaluation (Addendum)
Anesthesia Evaluation  Patient identified by MRN, date of birth, ID band Patient awake    Reviewed: Allergy & Precautions, NPO status , Patient's Chart, lab work & pertinent test results  Airway Mallampati: II  TM Distance: >3 FB Neck ROM: Full    Dental no notable dental hx. (+) Teeth Intact   Pulmonary former smoker,    Pulmonary exam normal breath sounds clear to auscultation       Cardiovascular negative cardio ROS Normal cardiovascular exam Rhythm:Regular Rate:Normal - Systolic murmurs    Neuro/Psych negative neurological ROS  negative psych ROS   GI/Hepatic Neg liver ROS, GERD  Medicated and Controlled,IBS Polypoid mass gastric antrum   Endo/Other  Hypothyroidism S/P RAI   Renal/GU negative Renal ROS  negative genitourinary   Musculoskeletal negative musculoskeletal ROS (+)   Abdominal   Peds  Hematology  (+) anemia ,   Anesthesia Other Findings   Reproductive/Obstetrics                         EKG: normal EKG, normal sinus rhythm.  Anesthesia Physical Anesthesia Plan  ASA: II  Anesthesia Plan: MAC   Post-op Pain Management:    Induction: Intravenous  Airway Management Planned: Natural Airway, Nasal Cannula and Simple Face Mask  Additional Equipment:   Intra-op Plan:   Post-operative Plan:   Informed Consent: I have reviewed the patients History and Physical, chart, labs and discussed the procedure including the risks, benefits and alternatives for the proposed anesthesia with the patient or authorized representative who has indicated his/her understanding and acceptance.     Plan Discussed with: Anesthesiologist, CRNA and Surgeon  Anesthesia Plan Comments:         Anesthesia Quick Evaluation

## 2015-06-15 NOTE — Transfer of Care (Signed)
Immediate Anesthesia Transfer of Care Note  Patient: Kathryn Gregory  Procedure(s) Performed: Procedure(s): UPPER ENDOSCOPIC ULTRASOUND (EUS) LINEAR (N/A)  Patient Location: PACU and Endoscopy Unit  Anesthesia Type:MAC  Level of Consciousness: awake, alert  and patient cooperative  Airway & Oxygen Therapy: Patient Spontanous Breathing and Patient connected to nasal cannula oxygen  Post-op Assessment: Report given to RN  Post vital signs: Reviewed and stable  Last Vitals:  Filed Vitals:   06/15/15 0807  BP: 101/65  Pulse: 75  Temp: 36.8 C  Resp: 21    Last Pain: There were no vitals filed for this visit.       Complications: No apparent anesthesia complications

## 2015-06-15 NOTE — H&P (Signed)
  Kathryn Gregory HPI: the patient has a history of an antral gastric polyp.  An EUS was attempted last time, but there was a significant amount of residual gastric contents and the procedure was terminated.  Past Medical History  Diagnosis Date  . Rosacea   . IBS (irritable bowel syndrome)   . ANEMIA-NOS   . GERD   . Other postablative hypothyroidism 07/2009    I-131ablation 07/2009 for hyperthyroid  . Lumbar herniated disc     not a problem now    Past Surgical History  Procedure Laterality Date  . Tubal ligation  1998  . Tonsillectomy    . Eus N/A 05/04/2015    Procedure: UPPER ENDOSCOPIC ULTRASOUND (EUS) LINEAR;  Surgeon: Carol Ada, MD;  Location: [facility];  Service: Endoscopy;  Laterality: N/A;    Family History  Problem Relation Age of Onset  . Mental illness Mother   . Lung disease Mother     lung fibrosis  . Breast cancer Maternal Aunt   . Colon cancer Paternal Uncle   . Dementia Maternal Grandmother   . Breast cancer Cousin 31  . Cancer Other     cancer of tongue  . Diabetes Father 62  . Multiple births Sister     3 sets of twins  . Cancer Maternal Uncle     unsure type  . Diabetes Paternal Grandmother     Social History:  reports that she has quit smoking. She quit smokeless tobacco use about 27 years ago. She reports that she drinks about 0.6 oz of alcohol per week. She reports that she does not use illicit drugs.  Allergies:  Allergies  Allergen Reactions  . Penicillins Other (See Comments)    Childhood allergy-unknown.  Has patient had a PCN reaction causing immediate rash, facial/tongue/throat swelling, SOB or lightheadedness with hypotension: no Has patient had a PCN reaction causing severe rash involving mucus membranes or skin necrosis: no Has patient had a PCN reaction that required hospitalization yes - she was in the hospital when occurred per her mother Has patient had a PCN reaction occurring within the last 10 years: no If all of the  above answers are "NO", then may procee    Medications: Scheduled: Continuous:  No results found for this or any previous visit (from the past 24 hour(s)).   No results found.  ROS:  As stated above in the HPI otherwise negative.  Last menstrual period 05/08/2015.    PE: Gen: NAD, Alert and Oriented HEENT:  [place]/AT, EOMI Neck: Supple, no LAD Lungs: CTA Bilaterally CV: RRR without M/G/R ABM: Soft, NTND, +BS Ext: No C/C/E  Assessment/Plan: 1) Gastric polyp - EUS today.  Possible resection.  Leonid Manus D 06/15/2015, 7:56 AM

## 2015-06-15 NOTE — Op Note (Signed)
[facility] Patient Name: Kathryn Gregory Procedure Date: 06/15/2015 MRN: [id number] Attending MD: Carol Ada , MD Date of Birth: 03-May-1965 CSN: [id number] Age: 50 Admit Type: Outpatient Procedure:                Upper EUS Indications:              Suspected gastric neoplasm Providers:                Carol Ada, MD, Vista Lawman, RN, Alfonso Patten,                            Technician, Dione Booze, CRNA Referring MD:              Medicines:                Propofol per Anesthesia Complications:            No immediate complications. Estimated Blood Loss:     Estimated blood loss was minimal. Procedure:                Pre-Anesthesia Assessment:                           - Prior to the procedure, a History and Physical                            was performed, and patient medications and                            allergies were reviewed. The patient's tolerance of                            previous anesthesia was also reviewed. The risks                            and benefits of the procedure and the sedation                            options and risks were discussed with the patient.                            All questions were answered, and informed consent                            was obtained. Prior Anticoagulants: The patient has                            taken no previous anticoagulant or antiplatelet                            agents. ASA Grade Assessment: II - A patient with                            mild systemic disease. After reviewing the risks  and benefits, the patient was deemed in                            satisfactory condition to undergo the procedure.                           After obtaining informed consent, the endoscope was                            passed under direct vision. Throughout the                            procedure, the patient's blood pressure, pulse, and                            oxygen  saturations were monitored continuously. The                            [id number] ([id number]) scope was introduced through                            the mouth, and advanced to the second part of                            duodenum. The upper EUS was technically difficult                            and complex. The patient tolerated the procedure                            well. Scope In: Scope Out: Findings:      Endosonographic Finding :      A hypoechoic pedunculated mass was identified endosonographically in the       antrum of the stomach. The mass measured 12 mm by 6 mm in maximal       cross-sectional diameter. The endosonographic borders were well-defined.       Fine needle aspiration for cytology was performed. Color Doppler imaging       was utilized prior to needle puncture to confirm a lack of significant       vascular structures within the needle path. One pass was made with the       22 gauge needle using a transgastric approach. A stylet was used. A       preliminary cytologic examination was not performed.      The procedure as difficult to perform as a result of the pedunculated       nature of the polyp. There was an apical ulceration that was       nonbleeding. The polyp measured 12 x 6 mm and it has a wide base.       Doppler ultrasound did reveal some vascularity. As a result of the       pedunculated shape of the polyp and its location in the antrum       identifying the layer of origin was not possible. There was a       significant amount of air artifact surrounding the polyp.  An attempt       with FNA was performed endoscopically and with EUS. Only one attempt       with several passes in that attempt was performed. The pedunculated       nature of the polyp results in movement of the polyp away from the       needle as well as some deflection of the needle. A possible sample was       obtained and it was sent off to cytology. Impression:               - A mass  was found in the antrum of the stomach.                            Fine needle aspiration performed. Moderate Sedation:      N/A- Per Anesthesia Care Recommendation:           - The patient will be observed post-procedure,                            until all discharge criteria are met.                           - Await FNA results. If the FNA results are                            nondiagnostic a Surgical consultation is warranted.                            This may represent a GIST by its gross appearance,                            i.e., submucosal presentation with an apical                            ulceration. Procedure Code(s):        --- Professional ---                           [phone], Esophagogastroduodenoscopy, flexible,                            transoral; with transendoscopic ultrasound-guided                            intramural or transmural fine needle                            aspiration/biopsy(s), (includes endoscopic                            ultrasound examination limited to the esophagus,                            stomach or duodenum, and adjacent structures) Diagnosis Code(s):        --- Professional ---  K31.89, Other diseases of stomach and duodenum CPT copyright 2016 [employer]. All rights reserved. The codes documented in this report are preliminary and upon coder review may  be revised to meet current compliance requirements. Carol Ada, MD Carol Ada, MD 06/15/2015 9:46:27 AM This report has been signed electronically. Number of Addenda: 0

## 2015-06-15 NOTE — Anesthesia Postprocedure Evaluation (Signed)
Anesthesia Post Note  Patient: Kathryn Gregory  Procedure(s) Performed: Procedure(s) (LRB): UPPER ENDOSCOPIC ULTRASOUND (EUS) LINEAR (N/A)  Patient location during evaluation: PACU Anesthesia Type: MAC Level of consciousness: awake and alert and oriented Pain management: pain level controlled Vital Signs Assessment: post-procedure vital signs reviewed and stable Respiratory status: spontaneous breathing, nonlabored ventilation and respiratory function stable Cardiovascular status: blood pressure returned to baseline and stable Postop Assessment: no signs of nausea or vomiting Anesthetic complications: no    Last Vitals:  Filed Vitals:   06/15/15 0807 06/15/15 0939  BP: 101/65 84/43  Pulse: 75 69  Temp: 36.8 C 37 C  Resp: 21 22    Last Pain: There were no vitals filed for this visit.               Gustave Lindeman A.

## 2015-06-15 NOTE — Discharge Instructions (Signed)
Gastrointestinal Endoscopy, Care After °Refer to this sheet in the next few weeks. These instructions provide you with information on caring for yourself after your procedure. Your caregiver may also give you more specific instructions. Your treatment has been planned according to current medical practices, but problems sometimes occur. Call your caregiver if you have any problems or questions after your procedure. °HOME CARE INSTRUCTIONS °· If you were given medicine to help you relax (sedative), do not drive, operate machinery, or sign important documents for 24 hours. °· Avoid alcohol and hot or warm beverages for the first 24 hours after the procedure. °· Only take over-the-counter or prescription medicines for pain, discomfort, or fever as directed by your caregiver. You may resume taking your normal medicines unless your caregiver tells you otherwise. Ask your caregiver when you may resume taking medicines that may cause bleeding, such as aspirin, clopidogrel, or warfarin. °· You may return to your normal diet and activities on the day after your procedure, or as directed by your caregiver. Walking may help to reduce any bloated feeling in your abdomen. °· Drink enough fluids to keep your urine clear or pale yellow. °· You may gargle with salt water if you have a sore throat. °SEEK IMMEDIATE MEDICAL CARE IF: °· You have severe nausea or vomiting. °· You have severe abdominal pain, abdominal cramps that last longer than 6 hours, or abdominal swelling (distention). °· You have severe shoulder or back pain. °· You have trouble swallowing. °· You have shortness of breath, your breathing is shallow, or you are breathing faster than normal. °· You have a fever or a rapid heartbeat. °· You vomit blood or material that looks like coffee grounds. °· You have bloody, black, or tarry stools. °MAKE SURE YOU: °· Understand these instructions. °· Will watch your condition. °· Will get help right away if you are not doing  well or get worse. °  °This information is not intended to replace advice given to you by your health care provider. Make sure you discuss any questions you have with your health care provider. °  °Document Released: 08/14/2003 Document Revised: 01/20/2014 Document Reviewed: 04/01/2011 °Elsevier Interactive Patient Education ©2016 [employer] ° °

## 2015-06-19 ENCOUNTER — Encounter (HOSPITAL_COMMUNITY): Payer: Self-pay | Admitting: Gastroenterology

## 2015-07-11 ENCOUNTER — Ambulatory Visit: Payer: Self-pay | Admitting: General Surgery

## 2015-07-11 DIAGNOSIS — K319 Disease of stomach and duodenum, unspecified: Secondary | ICD-10-CM | POA: Diagnosis not present

## 2015-07-11 NOTE — H&P (Signed)
Kathryn Gregory 07/11/2015 11:53 AM Location: [facility] Patient #: [id number] DOB: [birth year] Married / Language: English / Race: [place] Female  History of Present Illness Odis Hollingshead MD; 07/11/2015 12:49 PM) The patient is a 50 year old female.   Note:She is referred by Dr. Collene Mares for consultation regarding a mass in the gastric antrum. She saw Dr. Collene Mares because of iron deficiency anemia. Colonoscopy was performed without an obvious source of her anemia. Upper endoscopy was then performed which demonstrated a gastric polyp. She underwent endoscopic ultrasound following this and there is noted to be an ulceration on the tip of the polyp that was not bleeding. The polyp was felt to be too vascular to remove. Needle biopsy was attempted that had and the pathologist did look at some of the cells and saw no evidence of malignant cells. She has no abdominal pain or dyspepsia. No recent weight loss or weight gain. She is here with her husband.  Other Problems Jeralyn Ruths, [place]; 07/11/2015 12:52 PM) Back Pain Thyroid Disease Ventral Hernia Repair  Past Surgical History Jeralyn Ruths, [place]; 07/11/2015 12:52 PM) Breast Biopsy Left. Tonsillectomy  Diagnostic Studies History Jeralyn Ruths, [place]; 07/11/2015 12:52 PM) Colonoscopy within last year >10 years ago Pap Smear 1-5 years ago  Allergies Davy Pique Bynum, CMA; 07/11/2015 11:53 AM) No Known Drug Allergies 05/17/2014  Medication History (Sonya Bynum, CMA; 07/11/2015 11:53 AM) Levothyroxine Sodium (112MCG Tablet, Oral) Active. Meloxicam (15MG  Tablet, Oral) Active. Magnesium Oxide (250MG  Tablet, Oral) Active. MedroxyPROGESTERone Acetate (10MG  Tablet, Oral) Active. Medications Reconciled  Social History Jeralyn Ruths, [place]; 07/11/2015 12:52 PM) Alcohol use Occasional alcohol use. Caffeine use Coffee, Tea. No drug use Tobacco use Former smoker.  Family History Jeralyn Ruths, [place]; 07/11/2015 12:52  PM) Alcohol Abuse Brother, Sister, Son. Cerebrovascular Accident Father. Depression Brother. Diabetes Mellitus Family Members In General, Father. Heart Disease Father. Hypertension Brother, Son. Respiratory Condition Mother.  Pregnancy / Birth History Jeralyn Ruths, [place]; 07/11/2015 12:52 PM) Age at menarche 67 years, 58 years. Gravida 2 Para 2 Regular periods     Review of Systems ([place]; 07/11/2015 12:52 PM) General Not Present- Appetite Loss, Chills, Fatigue, Fever, Night Sweats, Weight Gain and Weight Loss. Skin Not Present- Change in Wart/Mole, Dryness, Hives, Jaundice, New Lesions, Non-Healing Wounds, Rash and Ulcer. HEENT Not Present- Earache, Hearing Loss, Hoarseness, Nose Bleed, Oral Ulcers, Ringing in the Ears, Seasonal Allergies, Sinus Pain, Sore Throat, Visual Disturbances, Wears glasses/contact lenses and Yellow Eyes. Respiratory Not Present- Bloody sputum, Chronic Cough, Difficulty Breathing, Snoring and Wheezing. Breast Not Present- Breast Mass, Breast Pain, Nipple Discharge and Skin Changes. Cardiovascular Not Present- Chest Pain, Difficulty Breathing Lying Down, Leg Cramps, Palpitations, Rapid Heart Rate, Shortness of Breath and Swelling of Extremities. Gastrointestinal Present- Bloating and Excessive gas. Not Present- Abdominal Pain, Bloody Stool, Change in Bowel Habits, Chronic diarrhea, Constipation, Difficulty Swallowing, Gets full quickly at meals, Hemorrhoids, Indigestion, Nausea, Rectal Pain and Vomiting. Female Genitourinary Not Present- Frequency, Nocturia, Painful Urination, Pelvic Pain and Urgency. Musculoskeletal Not Present- Back Pain, Joint Pain, Joint Stiffness, Muscle Pain, Muscle Weakness and Swelling of Extremities. Neurological Not Present- Decreased Memory, Fainting, Headaches, Numbness, Seizures, Tingling, Tremor, Trouble walking and Weakness. Psychiatric Not Present- Anxiety, Bipolar, Change in Sleep Pattern, Depression, Fearful  and Frequent crying.  Vitals (Sonya Bynum CMA; 07/11/2015 11:53 AM) 07/11/2015 11:53 AM Weight: 162 lb Height: 66in Body Surface Area: 1.83 m Body Mass Index: 26.15 kg/m  Temp.: 66F(Temporal)  Pulse: 81 (Regular)  BP: 124/78 (Sitting, Left Arm,  Standard)      Physical Exam Odis Hollingshead MD; 07/11/2015 12:50 PM)  The physical exam findings are as follows: Note:General: WDWN female in NAD. Pleasant and cooperative.  HEENT: [place]/AT, no external nasal, mucous membranes are moist  EYES: EOMI, no scleral icterus, pupils normal  NECK: Supple, no obvious mass or thyroid mass/enlargement, no trachea deviation  CV: RRR, no murmur, no edema  CHEST: Breath sounds equal and clear. Respirations nonlabored.  ABDOMEN: Soft, nontender, nondistended, no masses, no organomegaly, active bowel sounds, no scars, no hernias.  MUSCULOSKELETAL: FROM, good muscle tone, no edema, no venous stasis changes, normal station and gait  LYMPHATIC: No palpable cervical, supraclavicular adenopathy.  SKIN: No jaundice or suspicious rashes.  NEUROLOGIC: Alert and oriented, answers questions appropriately, normal gait and station.  PSYCHIATRIC: Normal mood, affect , and behavior.    Assessment & Plan Odis Hollingshead MD; 07/11/2015 12:56 PM)  GASTRIC MASS (K31.9) Impression: With the anemia, small ulcer on tip of area, and suspicion of submucosal lesion this is suspicious for a GIST. We discussed this. She is already done some reading on it. I reviewed a CT done in May 2016 and saw what I felt to be a small mass coming off the anterior wall of the gastric antrum intraluminally.  Plan: Laparoscopic possible open removal of gastric mass with endoscopic guidance. We discussed the procedure, alternatives, and risks as well as aftercare and recovery. Risks include but are not limited to bleeding, infection, wound healing problem, anesthesia, leak from gastric closure, injury to surrounding  organs, need for reoperative surgery. She and her husband seem to understand this and would like to proceed.  Jackolyn Confer, MD

## 2015-08-01 NOTE — Patient Instructions (Signed)
GENE NISKA  08/01/2015   Your procedure is scheduled on: 08/03/2015    Report to [facility]  Entrance take [facility]  elevators to 3rd floor to  [place] at   1120 AM.  Call this number if you have problems the morning of surgery [phone]   Remember: ONLY 1 PERSON MAY GO WITH YOU TO SHORT STAY TO GET  READY MORNING OF [place].  Do not eat food or drink liquids :After Midnight.     Take these medicines the morning of surgery with A SIP OF WATER: Synthroid                                 You may not have any metal on your body including hair pins and              piercings  Do not wear jewelry, make-up, lotions, powders or perfumes, deodorant             Do not wear nail polish.  Do not shave  48 hours prior to surgery.                 Do not bring valuables to the hospital. [place].  Contacts, dentures or bridgework may not be worn into surgery.  Leave suitcase in the car. After surgery it may be brought to your room.             Special instructions : coughing and deep breathing exercises, leg exercises               Please read over the following fact sheets you were given: _____________________________________________________________________             [facility] - Preparing for Surgery Before surgery, you can play an important role.  Because skin is not sterile, your skin needs to be as free of germs as possible.  You can reduce the number of germs on your skin by washing with CHG (chlorahexidine gluconate) soap before surgery.  CHG is an antiseptic cleaner which kills germs and bonds with the skin to continue killing germs even after washing. Please DO NOT use if you have an allergy to CHG or antibacterial soaps.  If your skin becomes reddened/irritated stop using the CHG and inform your nurse when you arrive at Short Stay. Do not shave (including legs and underarms) for at  least 48 hours prior to the first CHG shower.  You may shave your face/neck. Please follow these instructions carefully:  1.  Shower with CHG Soap the night before surgery and the  morning of Surgery.  2.  If you choose to wash your hair, wash your hair first as usual with your  normal  shampoo.  3.  After you shampoo, rinse your hair and body thoroughly to remove the  shampoo.                           4.  Use CHG as you would any other liquid soap.  You can apply chg directly  to the skin and wash  Gently with a scrungie or clean washcloth.  5.  Apply the CHG Soap to your body ONLY FROM THE NECK DOWN.   Do not use on face/ open                           Wound or open sores. Avoid contact with eyes, ears mouth and genitals (private parts).                       Wash face,  Genitals (private parts) with your normal soap.             6.  Wash thoroughly, paying special attention to the area where your surgery  will be performed.  7.  Thoroughly rinse your body with warm water from the neck down.  8.  DO NOT shower/wash with your normal soap after using and rinsing off  the CHG Soap.                9.  Pat yourself dry with a clean towel.            10.  Wear clean pajamas.            11.  Place clean sheets on your bed the night of your first shower and do not  sleep with pets. Day of Surgery : Do not apply any lotions/deodorants the morning of surgery.  Please wear clean clothes to the hospital/surgery center.  FAILURE TO FOLLOW THESE INSTRUCTIONS MAY RESULT IN THE CANCELLATION OF YOUR SURGERY PATIENT SIGNATURE_________________________________  NURSE SIGNATURE__________________________________  ________________________________________________________________________  WHAT IS A BLOOD TRANSFUSION? Blood Transfusion Information  A transfusion is the replacement of blood or some of its parts. Blood is made up of multiple cells which provide different functions.  Red  blood cells carry oxygen and are used for blood loss replacement.  Silber blood cells fight against infection.  Platelets control bleeding.  Plasma helps clot blood.  Other blood products are available for specialized needs, such as hemophilia or other clotting disorders. BEFORE THE TRANSFUSION  Who gives blood for transfusions?   Healthy volunteers who are fully evaluated to make sure their blood is safe. This is blood bank blood. Transfusion therapy is the safest it has ever been in the practice of medicine. Before blood is taken from a donor, a complete history is taken to make sure that person has no history of diseases nor engages in risky social behavior (examples are intravenous drug use or sexual activity with multiple partners). The donor's travel history is screened to minimize risk of transmitting infections, such as malaria. The donated blood is tested for signs of infectious diseases, such as HIV and hepatitis. The blood is then tested to be sure it is compatible with you in order to minimize the chance of a transfusion reaction. If you or a relative donates blood, this is often done in anticipation of surgery and is not appropriate for emergency situations. It takes many days to process the donated blood. RISKS AND COMPLICATIONS Although transfusion therapy is very safe and saves many lives, the main dangers of transfusion include:   Getting an infectious disease.  Developing a transfusion reaction. This is an allergic reaction to something in the blood you were given. Every precaution is taken to prevent this. The decision to have a blood transfusion has been considered carefully by your caregiver before blood is given. Blood is not given unless the benefits outweigh  the risks. AFTER THE TRANSFUSION  Right after receiving a blood transfusion, you will usually feel much better and more energetic. This is especially true if your red blood cells have gotten low (anemic). The  transfusion raises the level of the red blood cells which carry oxygen, and this usually causes an energy increase.  The nurse administering the transfusion will monitor you carefully for complications. HOME CARE INSTRUCTIONS  No special instructions are needed after a transfusion. You may find your energy is better. Speak with your caregiver about any limitations on activity for underlying diseases you may have. SEEK MEDICAL CARE IF:   Your condition is not improving after your transfusion.  You develop redness or irritation at the intravenous (IV) site. SEEK IMMEDIATE MEDICAL CARE IF:  Any of the following symptoms occur over the next 12 hours:  Shaking chills.  You have a temperature by mouth above 102 F (38.9 C), not controlled by medicine.  Chest, back, or muscle pain.  People around you feel you are not acting correctly or are confused.  Shortness of breath or difficulty breathing.  Dizziness and fainting.  You get a rash or develop hives.  You have a decrease in urine output.  Your urine turns a dark color or changes to pink, red, or brown. Any of the following symptoms occur over the next 10 days:  You have a temperature by mouth above 102 F (38.9 C), not controlled by medicine.  Shortness of breath.  Weakness after normal activity.  The [place] part of the eye turns yellow (jaundice).  You have a decrease in the amount of urine or are urinating less often.  Your urine turns a dark color or changes to pink, red, or brown. Document Released: 12/28/1999 Document Revised: 03/24/2011 Document Reviewed: 08/16/2007 [facility] Patient Information 2014 [place], [place].  _______________________________________________________________________

## 2015-08-02 ENCOUNTER — Encounter (HOSPITAL_COMMUNITY): Payer: Self-pay

## 2015-08-02 ENCOUNTER — Encounter (HOSPITAL_COMMUNITY)
Admission: RE | Admit: 2015-08-02 | Discharge: 2015-08-02 | Disposition: A | Discharge status: Home / Self Care | Payer: BLUE CROSS/BLUE SHIELD | Source: Ambulatory Visit | Attending: General Surgery | Admitting: General Surgery

## 2015-08-02 DIAGNOSIS — D131 Benign neoplasm of stomach: Secondary | ICD-10-CM | POA: Diagnosis not present

## 2015-08-02 DIAGNOSIS — D509 Iron deficiency anemia, unspecified: Secondary | ICD-10-CM | POA: Diagnosis not present

## 2015-08-02 DIAGNOSIS — K3189 Other diseases of stomach and duodenum: Secondary | ICD-10-CM | POA: Diagnosis not present

## 2015-08-02 DIAGNOSIS — D62 Acute posthemorrhagic anemia: Secondary | ICD-10-CM | POA: Diagnosis not present

## 2015-08-02 DIAGNOSIS — K319 Disease of stomach and duodenum, unspecified: Secondary | ICD-10-CM | POA: Diagnosis not present

## 2015-08-02 DIAGNOSIS — K317 Polyp of stomach and duodenum: Secondary | ICD-10-CM | POA: Diagnosis not present

## 2015-08-02 DIAGNOSIS — Z79899 Other long term (current) drug therapy: Secondary | ICD-10-CM | POA: Diagnosis not present

## 2015-08-02 LAB — COMPREHENSIVE METABOLIC PANEL
ALBUMIN: 4.3 g/dL (ref 3.5–5.0)
ALT: 31 U/L (ref 14–54)
AST: 38 U/L (ref 15–41)
Alkaline Phosphatase: 74 U/L (ref 38–126)
Anion gap: 6 (ref 5–15)
BUN: 13 mg/dL (ref 6–20)
CHLORIDE: 106 mmol/L (ref 101–111)
CO2: 27 mmol/L (ref 22–32)
CREATININE: 0.62 mg/dL (ref 0.44–1.00)
Calcium: 9.1 mg/dL (ref 8.9–10.3)
GFR calc Af Amer: >60 mL/min (ref >60)
GFR calc non Af Amer: >60 mL/min (ref >60)
Glucose, Bld: 91 mg/dL (ref 65–99)
Potassium: 4.3 mmol/L (ref 3.5–5.1)
Sodium: 139 mmol/L (ref 135–145)
Total Bilirubin: 0.7 mg/dL (ref 0.3–1.2)
Total Protein: 7.1 g/dL (ref 6.5–8.1)

## 2015-08-02 LAB — CBC WITH DIFFERENTIAL/PLATELET
Basophils Absolute: 0 10*3/uL (ref 0.0–0.1)
Basophils Relative: 1 %
EOS PCT: 5 %
Eosinophils Absolute: 0.3 10*3/uL (ref 0.0–0.7)
HCT: 32.9 % — ABNORMAL LOW (ref 36.0–46.0)
HEMOGLOBIN: 10.1 g/dL — AB (ref 12.0–15.0)
LYMPHS ABS: 1.4 10*3/uL (ref 0.7–4.0)
LYMPHS PCT: 26 %
MCH: 23.8 pg — ABNORMAL LOW (ref 26.0–34.0)
MCHC: 30.7 g/dL (ref 30.0–36.0)
MCV: 77.6 fL — AB (ref 78.0–100.0)
MONOS PCT: 7 %
Monocytes Absolute: 0.4 10*3/uL (ref 0.1–1.0)
NEUTROS PCT: 61 %
Neutro Abs: 3.3 10*3/uL (ref 1.7–7.7)
Platelets: 380 10*3/uL (ref 150–400)
RBC: 4.24 MIL/uL (ref 3.87–5.11)
RDW: 16.1 % — ABNORMAL HIGH (ref 11.5–15.5)
WBC: 5.4 10*3/uL (ref 4.0–10.5)

## 2015-08-02 LAB — ABO/RH: ABO/RH(D): O POS

## 2015-08-02 LAB — HCG, SERUM, QUALITATIVE: PREG SERUM: NEGATIVE

## 2015-08-02 NOTE — Progress Notes (Signed)
CBC with diff done 08/02/15 faxed via EPIC to Dr Zella Richer.

## 2015-08-02 NOTE — Progress Notes (Signed)
Called [facility] and spoke with Abigail Butts [place]) at office.  Patient has received no instructions regarding a bowel prep prior to surgery.  Patient has concerns since with endoscopic ultrasound she states she had food in her stomach due to her digestive problems and the ultrasound had to be repeated. She plans to eat a light diet today.  Patient had cottage cheese for breakfast and plans to eat soup at lunch.  Office made aware.  They will call Dr Zella Richer and be in touch with patient.  They have her phone number. Patient aware office will be calling her with any further preop instructions.

## 2015-08-03 ENCOUNTER — Inpatient Hospital Stay (HOSPITAL_COMMUNITY)
Admission: RE | Admit: 2015-08-03 | Discharge: 2015-08-06 | DRG: 394 | Disposition: A | Discharge status: Home / Self Care | Payer: BLUE CROSS/BLUE SHIELD | Source: Ambulatory Visit | Attending: General Surgery | Admitting: General Surgery

## 2015-08-03 ENCOUNTER — Inpatient Hospital Stay (HOSPITAL_COMMUNITY): Payer: BLUE CROSS/BLUE SHIELD | Admitting: Certified Registered Nurse Anesthetist

## 2015-08-03 ENCOUNTER — Encounter (HOSPITAL_COMMUNITY)
Admission: RE | Disposition: A | Discharge status: Home / Self Care | Payer: Self-pay | Source: Ambulatory Visit | Attending: General Surgery

## 2015-08-03 ENCOUNTER — Encounter (HOSPITAL_COMMUNITY): Payer: Self-pay | Admitting: Certified Registered Nurse Anesthetist

## 2015-08-03 DIAGNOSIS — K3189 Other diseases of stomach and duodenum: Secondary | ICD-10-CM | POA: Diagnosis not present

## 2015-08-03 DIAGNOSIS — D131 Benign neoplasm of stomach: Principal | ICD-10-CM | POA: Diagnosis present

## 2015-08-03 DIAGNOSIS — D62 Acute posthemorrhagic anemia: Secondary | ICD-10-CM | POA: Diagnosis not present

## 2015-08-03 DIAGNOSIS — K317 Polyp of stomach and duodenum: Secondary | ICD-10-CM | POA: Diagnosis not present

## 2015-08-03 DIAGNOSIS — K319 Disease of stomach and duodenum, unspecified: Secondary | ICD-10-CM | POA: Diagnosis present

## 2015-08-03 DIAGNOSIS — Z79899 Other long term (current) drug therapy: Secondary | ICD-10-CM | POA: Diagnosis not present

## 2015-08-03 DIAGNOSIS — D509 Iron deficiency anemia, unspecified: Secondary | ICD-10-CM | POA: Diagnosis present

## 2015-08-03 HISTORY — PX: LAPAROSCOPIC GASTRIC RESECTION: SHX1936

## 2015-08-03 LAB — TYPE AND SCREEN
ABO/RH(D): O POS
Antibody Screen: NEGATIVE

## 2015-08-03 SURGERY — LAPAROSCOPIC GASTRIC RESECTION
Anesthesia: General | Site: Abdomen

## 2015-08-03 MED ORDER — LIDOCAINE HCL (CARDIAC) 20 MG/ML IV SOLN
INTRAVENOUS | Status: DC | PRN
  Administered 2015-08-03: 75 mg via INTRAVENOUS

## 2015-08-03 MED ORDER — LACTATED RINGERS IV SOLN
INTRAVENOUS | Status: DC | PRN
  Administered 2015-08-03: 14:00:00 via INTRAVENOUS

## 2015-08-03 MED ORDER — SODIUM CHLORIDE 0.9% FLUSH: 9.0000 mL | INTRAVENOUS | Status: DC | PRN

## 2015-08-03 MED ORDER — HYDROMORPHONE HCL 1 MG/ML IJ SOLN
0.2500 mg | INTRAMUSCULAR | Status: DC | PRN
  Administered 2015-08-03 (×4): 0.5 mg via INTRAVENOUS

## 2015-08-03 MED ORDER — SODIUM CHLORIDE 0.9 % IJ SOLN
INTRAMUSCULAR | Status: AC
  Filled 2015-08-03: qty 10

## 2015-08-03 MED ORDER — ONDANSETRON HCL 4 MG/2ML IJ SOLN: 4.0000 mg | INTRAMUSCULAR | Status: DC | PRN

## 2015-08-03 MED ORDER — SUGAMMADEX SODIUM 200 MG/2ML IV SOLN
INTRAVENOUS | Status: AC
  Filled 2015-08-03: qty 2

## 2015-08-03 MED ORDER — SUGAMMADEX SODIUM 200 MG/2ML IV SOLN
INTRAVENOUS | Status: DC | PRN
  Administered 2015-08-03: 150 mg via INTRAVENOUS

## 2015-08-03 MED ORDER — FENTANYL CITRATE (PF) 250 MCG/5ML IJ SOLN
INTRAMUSCULAR | Status: AC
  Filled 2015-08-03: qty 5

## 2015-08-03 MED ORDER — DEXAMETHASONE SODIUM PHOSPHATE 10 MG/ML IJ SOLN
INTRAMUSCULAR | Status: AC
  Filled 2015-08-03: qty 1

## 2015-08-03 MED ORDER — HEPARIN SODIUM (PORCINE) 5000 UNIT/ML IJ SOLN
5000.0000 [IU] | Freq: Three times a day (TID) | INTRAMUSCULAR | Status: DC
  Administered 2015-08-04 – 2015-08-06 (×5): 5000 [IU] via SUBCUTANEOUS
  Filled 2015-08-03 (×5): qty 1

## 2015-08-03 MED ORDER — DEXAMETHASONE SODIUM PHOSPHATE 10 MG/ML IJ SOLN
INTRAMUSCULAR | Status: DC | PRN
  Administered 2015-08-03: 10 mg via INTRAVENOUS

## 2015-08-03 MED ORDER — CHLORHEXIDINE GLUCONATE CLOTH 2 % EX PADS: 6.0000 | MEDICATED_PAD | Freq: Once | CUTANEOUS | Status: DC

## 2015-08-03 MED ORDER — MIDAZOLAM HCL 2 MG/2ML IJ SOLN
INTRAMUSCULAR | Status: AC
  Filled 2015-08-03: qty 2

## 2015-08-03 MED ORDER — PROPOFOL 10 MG/ML IV BOLUS
INTRAVENOUS | Status: DC | PRN
  Administered 2015-08-03: 170 mg via INTRAVENOUS

## 2015-08-03 MED ORDER — ROCURONIUM BROMIDE 100 MG/10ML IV SOLN
INTRAVENOUS | Status: DC | PRN
  Administered 2015-08-03: 40 mg via INTRAVENOUS

## 2015-08-03 MED ORDER — 0.9 % SODIUM CHLORIDE (POUR BTL) OPTIME
TOPICAL | Status: DC | PRN
  Administered 2015-08-03: 1000 mL

## 2015-08-03 MED ORDER — DIPHENHYDRAMINE HCL 50 MG/ML IJ SOLN: 12.5000 mg | Freq: Four times a day (QID) | INTRAMUSCULAR | Status: DC | PRN

## 2015-08-03 MED ORDER — FAMOTIDINE IN NACL 20-0.9 MG/50ML-% IV SOLN
20.0000 mg | Freq: Two times a day (BID) | INTRAVENOUS | Status: DC
  Administered 2015-08-03 – 2015-08-05 (×5): 20 mg via INTRAVENOUS
  Filled 2015-08-03 (×6): qty 50

## 2015-08-03 MED ORDER — HYDROMORPHONE HCL 1 MG/ML IJ SOLN
INTRAMUSCULAR | Status: AC
  Filled 2015-08-03: qty 1

## 2015-08-03 MED ORDER — ONDANSETRON HCL 4 MG/2ML IJ SOLN
INTRAMUSCULAR | Status: DC | PRN
  Administered 2015-08-03 (×2): 2 mg via INTRAVENOUS

## 2015-08-03 MED ORDER — ONDANSETRON HCL 4 MG/2ML IJ SOLN
INTRAMUSCULAR | Status: AC
  Filled 2015-08-03: qty 2

## 2015-08-03 MED ORDER — ROCURONIUM BROMIDE 100 MG/10ML IV SOLN
INTRAVENOUS | Status: AC
Start: 2015-08-03 — End: 2015-08-03
  Filled 2015-08-03: qty 1

## 2015-08-03 MED ORDER — KCL IN DEXTROSE-NACL 20-5-0.9 MEQ/L-%-% IV SOLN
INTRAVENOUS | Status: DC
  Administered 2015-08-03 – 2015-08-05 (×4): via INTRAVENOUS
  Filled 2015-08-03 (×6): qty 1000

## 2015-08-03 MED ORDER — LACTATED RINGERS IV SOLN
INTRAVENOUS | Status: DC
  Administered 2015-08-03: 12:00:00 via INTRAVENOUS

## 2015-08-03 MED ORDER — DIPHENHYDRAMINE HCL 12.5 MG/5ML PO ELIX: 12.5000 mg | ORAL_SOLUTION | Freq: Four times a day (QID) | ORAL | Status: DC | PRN

## 2015-08-03 MED ORDER — ONDANSETRON HCL 4 MG/2ML IJ SOLN: 4.0000 mg | Freq: Four times a day (QID) | INTRAMUSCULAR | Status: DC | PRN

## 2015-08-03 MED ORDER — LIDOCAINE HCL (CARDIAC) 20 MG/ML IV SOLN
INTRAVENOUS | Status: AC
  Filled 2015-08-03: qty 5

## 2015-08-03 MED ORDER — FENTANYL CITRATE (PF) 100 MCG/2ML IJ SOLN
INTRAMUSCULAR | Status: DC | PRN
  Administered 2015-08-03 (×5): 50 ug via INTRAVENOUS

## 2015-08-03 MED ORDER — MIDAZOLAM HCL 5 MG/5ML IJ SOLN
INTRAMUSCULAR | Status: DC | PRN
  Administered 2015-08-03 (×2): 1 mg via INTRAVENOUS

## 2015-08-03 MED ORDER — NALOXONE HCL 0.4 MG/ML IJ SOLN: 0.4000 mg | INTRAMUSCULAR | Status: DC | PRN

## 2015-08-03 MED ORDER — TISSEEL VH 10 ML EX KIT
PACK | CUTANEOUS | Status: AC
  Filled 2015-08-03: qty 1

## 2015-08-03 MED ORDER — LEVOTHYROXINE SODIUM 112 MCG PO TABS
112.0000 ug | ORAL_TABLET | Freq: Every day | ORAL | Status: DC
  Administered 2015-08-04 – 2015-08-06 (×3): 112 ug via ORAL
  Filled 2015-08-03 (×3): qty 1

## 2015-08-03 MED ORDER — LACTATED RINGERS IR SOLN
Status: DC | PRN
  Administered 2015-08-03: 1000 mL

## 2015-08-03 MED ORDER — BUPIVACAINE HCL (PF) 0.5 % IJ SOLN
INTRAMUSCULAR | Status: AC
Start: 2015-08-03 — End: 2015-08-03
  Filled 2015-08-03: qty 30

## 2015-08-03 MED ORDER — ONDANSETRON 4 MG PO TBDP: 4.0000 mg | ORAL_TABLET | Freq: Four times a day (QID) | ORAL | Status: DC | PRN

## 2015-08-03 MED ORDER — EPHEDRINE SULFATE 50 MG/ML IJ SOLN
INTRAMUSCULAR | Status: AC
  Filled 2015-08-03: qty 1

## 2015-08-03 MED ORDER — MORPHINE SULFATE 2 MG/ML IV SOLN
INTRAVENOUS | Status: DC
Start: 2015-08-03 — End: 2015-08-04
  Administered 2015-08-03: 2 mg via INTRAVENOUS
  Administered 2015-08-04: 3 mg via INTRAVENOUS
  Administered 2015-08-04: 1 mg via INTRAVENOUS
  Filled 2015-08-03: qty 25

## 2015-08-03 MED ORDER — PROPOFOL 10 MG/ML IV BOLUS
INTRAVENOUS | Status: AC
  Filled 2015-08-03: qty 20

## 2015-08-03 MED ORDER — BUPIVACAINE HCL (PF) 0.5 % IJ SOLN
INTRAMUSCULAR | Status: DC | PRN
  Administered 2015-08-03: 11 mL

## 2015-08-03 MED ORDER — PROMETHAZINE HCL 25 MG/ML IJ SOLN: 6.2500 mg | INTRAMUSCULAR | Status: DC | PRN

## 2015-08-03 MED ORDER — PHENYLEPHRINE HCL 10 MG/ML IJ SOLN
INTRAMUSCULAR | Status: DC | PRN
  Administered 2015-08-03: 40 ug via INTRAVENOUS

## 2015-08-03 MED ORDER — VANCOMYCIN HCL IN DEXTROSE 1-5 GM/200ML-% IV SOLN
1000.0000 mg | INTRAVENOUS | Status: AC
  Administered 2015-08-03 (×2): 1000 mg via INTRAVENOUS
  Filled 2015-08-03: qty 200

## 2015-08-03 SURGICAL SUPPLY — 73 items
APL SKNCLS STERI-STRIP NONHPOA (GAUZE/BANDAGES/DRESSINGS) ×1
APPLIER CLIP ROT 10 11.4 M/L (STAPLE)
APR CLP MED LRG 11.4X10 (STAPLE)
BAG SPEC RTRVL LRG 6X4 10 (ENDOMECHANICALS)
BENZOIN TINCTURE PRP APPL 2/3 (GAUZE/BANDAGES/DRESSINGS) ×1 IMPLANT
BLADE HEX COATED 2.75 (ELECTRODE) ×1 IMPLANT
CLAMP ENDO BABCK 10MM (STAPLE) IMPLANT
CLIP APPLIE ROT 10 11.4 M/L (STAPLE) IMPLANT
COVER MAYO STAND STRL (DRAPES) ×2 IMPLANT
COVER SURGICAL LIGHT HANDLE (MISCELLANEOUS) ×2 IMPLANT
DRAPE LAPAROSCOPIC ABDOMINAL (DRAPES) ×2 IMPLANT
DRAPE WARM FLUID 44X44 (DRAPE) ×1 IMPLANT
DRSG OPSITE POSTOP 4X6 (GAUZE/BANDAGES/DRESSINGS) ×1 IMPLANT
DRSG TEGADERM 2-3/8X2-3/4 SM (GAUZE/BANDAGES/DRESSINGS) ×4 IMPLANT
ELECT PENCIL ROCKER SW 15FT (MISCELLANEOUS) ×2 IMPLANT
ELECT REM PT RETURN 9FT ADLT (ELECTROSURGICAL) ×2
ELECTRODE REM PT RTRN 9FT ADLT (ELECTROSURGICAL) ×1 IMPLANT
ENDOLOOP SUT PDS II  0 18 (SUTURE)
ENDOLOOP SUT PDS II 0 18 (SUTURE) IMPLANT
GAUZE SPONGE 2X2 8PLY STRL LF (GAUZE/BANDAGES/DRESSINGS) IMPLANT
GAUZE SPONGE 4X4 12PLY STRL (GAUZE/BANDAGES/DRESSINGS) ×1 IMPLANT
GLOVE BIO SURGEON STRL SZ7 (GLOVE) ×2 IMPLANT
GLOVE BIOGEL PI IND STRL 7.0 (GLOVE) ×1 IMPLANT
GLOVE BIOGEL PI IND STRL 7.5 (GLOVE) ×1 IMPLANT
GLOVE BIOGEL PI INDICATOR 7.0 (GLOVE) ×1
GLOVE BIOGEL PI INDICATOR 7.5 (GLOVE) ×1
GOWN STRL REUS W/ TWL XL LVL3 (GOWN DISPOSABLE) ×1 IMPLANT
GOWN STRL REUS W/TWL LRG LVL3 (GOWN DISPOSABLE) ×4 IMPLANT
GOWN STRL REUS W/TWL XL LVL3 (GOWN DISPOSABLE) ×4 IMPLANT
IRRIG SUCT STRYKERFLOW 2 WTIP (MISCELLANEOUS) ×2
IRRIGATION SUCT STRKRFLW 2 WTP (MISCELLANEOUS) ×1 IMPLANT
KIT BASIN OR (CUSTOM PROCEDURE TRAY) ×2 IMPLANT
LIGASURE IMPACT 36 18CM CVD LR (INSTRUMENTS) IMPLANT
LIQUID BAND (GAUZE/BANDAGES/DRESSINGS) IMPLANT
MARKER SKIN DUAL TIP RULER LAB (MISCELLANEOUS) IMPLANT
NS IRRIG 1000ML POUR BTL (IV SOLUTION) ×3 IMPLANT
POUCH SPECIMEN RETRIEVAL 10MM (ENDOMECHANICALS) IMPLANT
SCISSORS LAP 5X35 DISP (ENDOMECHANICALS) ×1 IMPLANT
SHEARS HARMONIC ACE PLUS 36CM (ENDOMECHANICALS) ×1 IMPLANT
SPONGE GAUZE 2X2 STER 10/PKG (GAUZE/BANDAGES/DRESSINGS) ×1
SPONGE LAP 18X18 X RAY DECT (DISPOSABLE) ×2 IMPLANT
STAPLER 90 3.5 STAND SLIM (STAPLE)
STAPLER 90 3.5 STD SLIM (STAPLE) IMPLANT
STAPLER VISISTAT 35W (STAPLE) ×1 IMPLANT
STRIP CLOSURE SKIN 1/2X4 (GAUZE/BANDAGES/DRESSINGS) ×1 IMPLANT
SUT MNCRL AB 3-0 PS2 18 (SUTURE) ×1 IMPLANT
SUT MNCRL AB 4-0 PS2 18 (SUTURE) IMPLANT
SUT SILK 2 0 (SUTURE) ×2
SUT SILK 2 0 SH CR/8 (SUTURE) ×4 IMPLANT
SUT SILK 2-0 18XBRD TIE 12 (SUTURE) ×1 IMPLANT
SUT SILK 3 0 (SUTURE)
SUT SILK 3 0 SH CR/8 (SUTURE) IMPLANT
SUT SILK 3-0 18XBRD TIE 12 (SUTURE) IMPLANT
SUT VIC AB 1 CTX 36 (SUTURE) ×4
SUT VIC AB 1 CTX36XBRD ANBCTR (SUTURE) IMPLANT
SUT VIC AB 2-0 SH 27 (SUTURE) ×2
SUT VIC AB 2-0 SH 27X BRD (SUTURE) IMPLANT
SYR BULB IRRIGATION 50ML (SYRINGE) ×1 IMPLANT
TIP INNERVISION DETACH 40FR (MISCELLANEOUS) IMPLANT
TIP INNERVISION DETACH 50FR (MISCELLANEOUS) IMPLANT
TIP INNERVISION DETACH 56FR (MISCELLANEOUS) IMPLANT
TIPS INNERVISION DETACH 40FR (MISCELLANEOUS)
TOWEL OR 17X26 10 PK STRL BLUE (TOWEL DISPOSABLE) ×3 IMPLANT
TRAY FOLEY W/METER SILVER 14FR (SET/KITS/TRAYS/PACK) ×2 IMPLANT
TRAY FOLEY W/METER SILVER 16FR (SET/KITS/TRAYS/PACK) ×1 IMPLANT
TRAY LAPAROSCOPIC (CUSTOM PROCEDURE TRAY) ×2 IMPLANT
TROCAR BLADELESS OPT 5 75 (ENDOMECHANICALS) ×2 IMPLANT
TROCAR SLEEVE XCEL 5X75 (ENDOMECHANICALS) ×1 IMPLANT
TROCAR XCEL BLUNT TIP 100MML (ENDOMECHANICALS) IMPLANT
TROCAR XCEL NON-BLD 11X100MML (ENDOMECHANICALS) ×1 IMPLANT
TROCAR XCEL UNIV SLVE 11M 100M (ENDOMECHANICALS) IMPLANT
TUBING INSUF HEATED (TUBING) ×2 IMPLANT
YANKAUER SUCT BULB TIP 10FT TU (MISCELLANEOUS) ×3 IMPLANT

## 2015-08-03 NOTE — H&P (View-Only) (Signed)
Kathryn Gregory 07/11/2015 11:53 AM Location: [facility] Patient #: [id number] DOB: [birth year] Married / Language: English / Race: [place] Female  History of Present Illness Odis Hollingshead MD; 07/11/2015 12:49 PM) The patient is a 50 year old female.   Note:She is referred by Dr. Collene Mares for consultation regarding a mass in the gastric antrum. She saw Dr. Collene Mares because of iron deficiency anemia. Colonoscopy was performed without an obvious source of her anemia. Upper endoscopy was then performed which demonstrated a gastric polyp. She underwent endoscopic ultrasound following this and there is noted to be an ulceration on the tip of the polyp that was not bleeding. The polyp was felt to be too vascular to remove. Needle biopsy was attempted that had and the pathologist did look at some of the cells and saw no evidence of malignant cells. She has no abdominal pain or dyspepsia. No recent weight loss or weight gain. She is here with her husband.  Other Problems Jeralyn Ruths, [place]; 07/11/2015 12:52 PM) Back Pain Thyroid Disease Ventral Hernia Repair  Past Surgical History Jeralyn Ruths, [place]; 07/11/2015 12:52 PM) Breast Biopsy Left. Tonsillectomy  Diagnostic Studies History Jeralyn Ruths, [place]; 07/11/2015 12:52 PM) Colonoscopy within last year >10 years ago Pap Smear 1-5 years ago  Allergies Davy Pique Bynum, CMA; 07/11/2015 11:53 AM) No Known Drug Allergies 05/17/2014  Medication History (Sonya Bynum, CMA; 07/11/2015 11:53 AM) Levothyroxine Sodium (112MCG Tablet, Oral) Active. Meloxicam (15MG  Tablet, Oral) Active. Magnesium Oxide (250MG  Tablet, Oral) Active. MedroxyPROGESTERone Acetate (10MG  Tablet, Oral) Active. Medications Reconciled  Social History Jeralyn Ruths, [place]; 07/11/2015 12:52 PM) Alcohol use Occasional alcohol use. Caffeine use Coffee, Tea. No drug use Tobacco use Former smoker.  Family History Jeralyn Ruths, [place]; 07/11/2015 12:52  PM) Alcohol Abuse Brother, Sister, Son. Cerebrovascular Accident Father. Depression Brother. Diabetes Mellitus Family Members In General, Father. Heart Disease Father. Hypertension Brother, Son. Respiratory Condition Mother.  Pregnancy / Birth History Jeralyn Ruths, [place]; 07/11/2015 12:52 PM) Age at menarche 65 years, 32 years. Gravida 2 Para 2 Regular periods     Review of Systems ([place]; 07/11/2015 12:52 PM) General Not Present- Appetite Loss, Chills, Fatigue, Fever, Night Sweats, Weight Gain and Weight Loss. Skin Not Present- Change in Wart/Mole, Dryness, Hives, Jaundice, New Lesions, Non-Healing Wounds, Rash and Ulcer. HEENT Not Present- Earache, Hearing Loss, Hoarseness, Nose Bleed, Oral Ulcers, Ringing in the Ears, Seasonal Allergies, Sinus Pain, Sore Throat, Visual Disturbances, Wears glasses/contact lenses and Yellow Eyes. Respiratory Not Present- Bloody sputum, Chronic Cough, Difficulty Breathing, Snoring and Wheezing. Breast Not Present- Breast Mass, Breast Pain, Nipple Discharge and Skin Changes. Cardiovascular Not Present- Chest Pain, Difficulty Breathing Lying Down, Leg Cramps, Palpitations, Rapid Heart Rate, Shortness of Breath and Swelling of Extremities. Gastrointestinal Present- Bloating and Excessive gas. Not Present- Abdominal Pain, Bloody Stool, Change in Bowel Habits, Chronic diarrhea, Constipation, Difficulty Swallowing, Gets full quickly at meals, Hemorrhoids, Indigestion, Nausea, Rectal Pain and Vomiting. Female Genitourinary Not Present- Frequency, Nocturia, Painful Urination, Pelvic Pain and Urgency. Musculoskeletal Not Present- Back Pain, Joint Pain, Joint Stiffness, Muscle Pain, Muscle Weakness and Swelling of Extremities. Neurological Not Present- Decreased Memory, Fainting, Headaches, Numbness, Seizures, Tingling, Tremor, Trouble walking and Weakness. Psychiatric Not Present- Anxiety, Bipolar, Change in Sleep Pattern, Depression, Fearful  and Frequent crying.  Vitals (Sonya Bynum CMA; 07/11/2015 11:53 AM) 07/11/2015 11:53 AM Weight: 162 lb Height: 66in Body Surface Area: 1.83 m Body Mass Index: 26.15 kg/m  Temp.: 21F(Temporal)  Pulse: 81 (Regular)  BP: 124/78 (Sitting, Left Arm,  Standard)      Physical Exam Odis Hollingshead MD; 07/11/2015 12:50 PM)  The physical exam findings are as follows: Note:General: WDWN female in NAD. Pleasant and cooperative.  HEENT: [place]/AT, no external nasal, mucous membranes are moist  EYES: EOMI, no scleral icterus, pupils normal  NECK: Supple, no obvious mass or thyroid mass/enlargement, no trachea deviation  CV: RRR, no murmur, no edema  CHEST: Breath sounds equal and clear. Respirations nonlabored.  ABDOMEN: Soft, nontender, nondistended, no masses, no organomegaly, active bowel sounds, no scars, no hernias.  MUSCULOSKELETAL: FROM, good muscle tone, no edema, no venous stasis changes, normal station and gait  LYMPHATIC: No palpable cervical, supraclavicular adenopathy.  SKIN: No jaundice or suspicious rashes.  NEUROLOGIC: Alert and oriented, answers questions appropriately, normal gait and station.  PSYCHIATRIC: Normal mood, affect , and behavior.    Assessment & Plan Odis Hollingshead MD; 07/11/2015 12:56 PM)  GASTRIC MASS (K31.9) Impression: With the anemia, small ulcer on tip of area, and suspicion of submucosal lesion this is suspicious for a GIST. We discussed this. She is already done some reading on it. I reviewed a CT done in May 2016 and saw what I felt to be a small mass coming off the anterior wall of the gastric antrum intraluminally.  Plan: Laparoscopic possible open removal of gastric mass with endoscopic guidance. We discussed the procedure, alternatives, and risks as well as aftercare and recovery. Risks include but are not limited to bleeding, infection, wound healing problem, anesthesia, leak from gastric closure, injury to surrounding  organs, need for reoperative surgery. She and her husband seem to understand this and would like to proceed.  Jackolyn Confer, MD

## 2015-08-03 NOTE — Anesthesia Postprocedure Evaluation (Signed)
Anesthesia Post Note  Patient: Kathryn Gregory  Procedure(s) Performed: Procedure(s) (LRB): LAPAROSCOPIC GASTROTOMY WITH EXCISION OF  GASTIC TUMOR (N/A)  Patient location during evaluation: PACU Anesthesia Type: General Level of consciousness: sedated Pain management: pain level controlled Vital Signs Assessment: post-procedure vital signs reviewed and stable Respiratory status: spontaneous breathing and respiratory function stable Cardiovascular status: stable Anesthetic complications: no    Last Vitals:  Filed Vitals:   08/03/15 1700 08/03/15 1715  BP: 103/68 100/65  Pulse: 72 71  Temp:  36.4 C  Resp: 12 11    Last Pain:  Filed Vitals:   08/03/15 1716  PainSc: [place]

## 2015-08-03 NOTE — Op Note (Signed)
08/03/2015  3:59 PM  PATIENT:  Kathryn Gregory, 50 y.o., female, MRN: [id number]  PREOP DIAGNOSIS:  Antral gastric tumor  POSTOP DIAGNOSIS:   Antral gastric tumor  PROCEDURE:  Esophagogastroduedonoscopy - localization of tumor  SURGEON:   Alphonsa Overall, M.D.  ANESTHESIA:   General  INDICATIONS FOR PROCEDURE:  [name] is a 50 y.o. (DOB: Oct 26, 1965)  [name]  female whose primary care physician is Hoyt Koch, MD.   Dr. Zella Richer is exploring her to resect an antral gastric tumor.  This tumor was identified on endoscopy by Drs. Collene Mares and [place].   I am doing the endoscopy to localize the tumor and check the closure post op.  PROCEDURE:  The patient is under general anesthesia.  Dr. Zella Richer is laparoscoping the patient while I do an upper endoscopy using a Pentax endoscope to evaluate the location of a tumor in the distal stomach.   I passed the endoscope down the back of her throat without difficulty.  The EG junction is at 40 cm.  The stomach was unremarkable. We identified an approximate 1.5 cm tumor, with a small ulcer, in the antrum of the stomach.  There was no evidence of serosal extension of the tumor.  But with endoscopy, we were able to localize the tumor.   Dr. Zella Richer thought it too close to the pylorus to do a laparoscopic excision.     He went through an upper abdominal laparotomy incision, then made a gastrostomy in the antrum, but not through pylorus, and resected the gastric antral tumor. .  We identified and resected the tumor.  He then closed the stomach.   I then rescoped the patient to evaluate the resection and stomach closure.  I could see the resection line endoscopically and I could see the the pylorus and went through ti.  Though there was some narrowing from the excision, could easily get the scope by it and pass the scope into the pylorus and into the proximal duodenum.   I then removed the scope.  Dr. Zella Richer will dictate the primary  operation.  Alphonsa Overall, MD, [facility] Pager: [phone] Office phone:  [phone]

## 2015-08-03 NOTE — Transfer of Care (Signed)
Immediate Anesthesia Transfer of Care Note  Patient: Kathryn Gregory  Procedure(s) Performed: Procedure(s): LAPAROSCOPIC GASTROTOMY WITH EXCISION OF  GASTIC TUMOR (N/A)  Patient Location: PACU  Anesthesia Type:General  Level of Consciousness: awake, alert , oriented and patient cooperative  Airway & Oxygen Therapy: Patient Spontanous Breathing and Patient connected to face mask oxygen  Post-op Assessment: Report given to RN and Post -op Vital signs reviewed and stable  Post vital signs: Reviewed and stable  Last Vitals:  Filed Vitals:   08/03/15 1139  BP: 121/72  Pulse: 71  Temp: 36.8 C  Resp: 16    Last Pain: There were no vitals filed for this visit.    Patients Stated Pain Goal: 4 ([id number])  Complications: No apparent anesthesia complications

## 2015-08-03 NOTE — Interval H&P Note (Signed)
History and Physical Interval Note:  08/03/2015 1:20 PM  Kathryn Gregory  has presented today for surgery, with the diagnosis of gastric mass  The various methods of treatment have been discussed with the patient and family. After consideration of risks, benefits and other options for treatment, the patient has consented to  Procedure(s): LAPAROSCOPIC POSSIBLE OPEN REMOVAL GASTIC MASS (N/A) as a surgical intervention .  The patient's history has been reviewed, patient examined, no change in status, stable for surgery.  I have reviewed the patient's chart and labs.  Questions were answered to the patient's satisfaction.     Raechell Singleton Lenna Sciara

## 2015-08-03 NOTE — Op Note (Signed)
Operative Note  Kathryn Gregory female 50 y.o. 08/03/2015  PREOPERATIVE DX:  Gastric tumor  POSTOPERATIVE DX:  Same  PROCEDURE:   Laparoscopic-assisted gastrotomy with excision of gastric tumor.  Upper endoscopy.         Surgeon: Odis Hollingshead   Assistants: Alphonsa Overall M.D.  Anesthesia: General endotracheal anesthesia  Indications:   This is a 50 year old female with iron deficiency anemia. Colonoscopy was unrevealing for source. Upper endoscopy, however, demonstrated a  Lesion in the gastric antrum. Biopsy did not demonstrate malignant cells but was felt this could be a GIST and thus the source of bleeding. She now presents for procedure.    Procedure Detail:  She was brought to the operative and placed supine on. General anesthetic was given. Foley catheter was inserted. Abdominal wall was widely sterilely prepped and draped. A timeout was performed.  A small subumbilical incision was made through the skin, subcutaneous tissue, fascia, and peritoneum entering the peritoneal cavity under direct vision. A pursestring suture of 0 Vicryl was placed around the edges of the fascia. A Hassan trocar was introduced in the peritoneal cavity and pneumoperitoneum created with insufflation of CO2 gas. A laparoscope was introduced and there is no underlying organ injury or bleeding. A 5 mm trocar was placed in the left mid abdomen. There were adhesions noted between the left upper quadrant abdominal wall and omentum as well as a right upper quadrant abdominal wall and omentum. These were divided. The stomach was visualized and I inspected the area of the antrum and pylorus. There was an adhesion between the omentum and antral area which was divided sharply. I then mobilized the antrum and distal by the stomach by  Incising the lesser omentum using Harmonic scalpel.  5 mm trocars were placed in the left upper quadrant and one in the right upper quadrant. I could not definitively identify the lesion  via external inspection.  Dr. Lucia Gaskins then performed upper endoscopy and identified the lesion in the posterior wall of the antrum approximately 5-6 cm proximal to the pylorus.  I was able to approximate this part of the stomach to the anterior abdominal wall in the epigastric region. A mini laparotomy was then performed and the antrum of the stomach brought up into that wound. I then made a gastrotomy in the anterior distal antrum and delivered the mass through the gastrotomy.. There was a small blood clot present on the mass. Using a Kelly clamp, I clamped below the mass and then excised the mass with Harmonic scalpel. I then undersewed this area with a running 2-0 Vicryl suture. This closure was solid without evidence of leak. The gastrostomy was then closed in 2 layers with running 2-0 Vicryl suture followed by 2-0 silk sutures in a Lembert fashion.    Repeat endoscopy was performed which demonstrated no leak from the gastrotomy closure or excision site.  There is also no obstruction as the scope could easily be passed through the pylorus into the duodenum. There was no evidence of bleeding.  The fascia of the mini laparotomy was then closed with a running #1 PDS suture.  Repeat laparoscopy was performed. 4 quadrant inspection demonstrated no evidence of bleeding or organ injury. The [facility] trocar was removed and the fascial defect closed under laparoscopic vision by tightening up and tying down the pursestring suture. The remaining trocars were removed. The pneumoperitoneum was released.  Skin incisions were closed with Monocryl running subcuticular stitches. Steri-Strips and sterile dressings were applied.  She tolerated procedure  well without apparent complications and was taken recovery in satisfactory condition.   Estimated Blood Loss:  150 ml             Specimens: gastric mass        Complications:  * No complications entered in OR log *         Disposition: PACU - hemodynamically  stable.         Condition: stable

## 2015-08-03 NOTE — Anesthesia Preprocedure Evaluation (Addendum)
Anesthesia Evaluation  Patient identified by MRN, date of birth, ID band Patient awake    Reviewed: Allergy & Precautions, NPO status , Patient's Chart, lab work & pertinent test results  Airway Mallampati: II  TM Distance: >3 FB     Dental no notable dental hx. (+) Dental Advisory Given   Pulmonary former smoker,    Pulmonary exam normal        Cardiovascular negative cardio ROS Normal cardiovascular exam     Neuro/Psych negative neurological ROS  negative psych ROS   GI/Hepatic Neg liver ROS, GERD  ,  Endo/Other  Hypothyroidism   Renal/GU negative Renal ROS     Musculoskeletal   Abdominal   Peds  Hematology   Anesthesia Other Findings   Reproductive/Obstetrics                            Anesthesia Physical Anesthesia Plan  ASA: II  Anesthesia Plan: General   Post-op Pain Management:    Induction: Intravenous  Airway Management Planned: Oral ETT  Additional Equipment:   Intra-op Plan:   Post-operative Plan: Extubation in OR  Informed Consent: I have reviewed the patients History and Physical, chart, labs and discussed the procedure including the risks, benefits and alternatives for the proposed anesthesia with the patient or authorized representative who has indicated his/her understanding and acceptance.   Dental advisory given  Plan Discussed with: CRNA, Anesthesiologist and Surgeon  Anesthesia Plan Comments:        Anesthesia Quick Evaluation

## 2015-08-04 LAB — CBC
HCT: 29.6 % — ABNORMAL LOW (ref 36.0–46.0)
Hemoglobin: 9.1 g/dL — ABNORMAL LOW (ref 12.0–15.0)
MCH: 23.6 pg — ABNORMAL LOW (ref 26.0–34.0)
MCHC: 30.7 g/dL (ref 30.0–36.0)
MCV: 76.9 fL — ABNORMAL LOW (ref 78.0–100.0)
Platelets: 316 10*3/uL (ref 150–400)
RBC: 3.85 MIL/uL — ABNORMAL LOW (ref 3.87–5.11)
RDW: 16 % — ABNORMAL HIGH (ref 11.5–15.5)
WBC: 10.7 10*3/uL — ABNORMAL HIGH (ref 4.0–10.5)

## 2015-08-04 LAB — BASIC METABOLIC PANEL
ANION GAP: 5 (ref 5–15)
BUN: 9 mg/dL (ref 6–20)
CALCIUM: 8 mg/dL — AB (ref 8.9–10.3)
CHLORIDE: 106 mmol/L (ref 101–111)
CO2: 25 mmol/L (ref 22–32)
Creatinine, Ser: 0.52 mg/dL (ref 0.44–1.00)
GFR calc Af Amer: >60 mL/min (ref >60)
GFR calc non Af Amer: >60 mL/min (ref >60)
GLUCOSE: 140 mg/dL — AB (ref 65–99)
Potassium: 4.1 mmol/L (ref 3.5–5.1)
Sodium: 136 mmol/L (ref 135–145)

## 2015-08-04 MED ORDER — MORPHINE SULFATE (PF) 2 MG/ML IV SOLN
2.0000 mg | INTRAVENOUS | Status: DC | PRN
  Administered 2015-08-04 – 2015-08-05 (×3): 2 mg via INTRAVENOUS
  Filled 2015-08-04 (×3): qty 1

## 2015-08-04 NOTE — Progress Notes (Signed)
Wasted 43ml of morphine PCA, witnessed by [facility].

## 2015-08-04 NOTE — Progress Notes (Signed)
1 Day Post-Op  Subjective: Not having much pain.    Objective: Vital signs in last 24 hours: Temp:  [97.6 F (36.4 C)-98.8 F (37.1 C)] 98.5 F (36.9 C) (07/22 0549) Pulse Rate:  [60-73] 60 (07/22 0549) Resp:  [11-20] 14 (07/22 0800) BP: (94-121)/(59-73) 94/59 mmHg (07/22 0549) SpO2:  [99 %-100 %] 100 % (07/22 0800) Weight:  [73.029 kg (161 lb)-76 kg (167 lb 8.8 oz)] 76 kg (167 lb 8.8 oz) (07/21 1730) Last BM Date: 08/02/15  Intake/Output from previous day: 07/21 0701 - 07/22 0700 In: 3231.7 [I.V.:3181.7; IV Piggyback:50] Out: [id number] [Urine:650; Blood:25] Intake/Output this shift:    PE: General- In NAD Abdomen-soft, active bowel sounds, dressings dry  Lab Results:   Recent Labs  08/02/15 0835 08/04/15 0523  WBC 5.4 10.7*  HGB 10.1* 9.1*  HCT 32.9* 29.6*  PLT 380 316   BMET  Recent Labs  08/02/15 0835 08/04/15 0523  NA 139 136  K 4.3 4.1  CL 106 106  CO2 27 25  GLUCOSE 91 140*  BUN 13 9  CREATININE 0.62 0.52  CALCIUM 9.1 8.0*   PT/INR No results for input(s): LABPROT, INR in the last 72 hours. Comprehensive Metabolic Panel:    Component Value Date/Time   NA 136 08/04/2015 0523   NA 139 08/02/2015 0835   K 4.1 08/04/2015 0523   K 4.3 08/02/2015 0835   CL 106 08/04/2015 0523   CL 106 08/02/2015 0835   CO2 25 08/04/2015 0523   CO2 27 08/02/2015 0835   BUN 9 08/04/2015 0523   BUN 13 08/02/2015 0835   CREATININE 0.52 08/04/2015 0523   CREATININE 0.62 08/02/2015 0835   GLUCOSE 140* 08/04/2015 0523   GLUCOSE 91 08/02/2015 0835   CALCIUM 8.0* 08/04/2015 0523   CALCIUM 9.1 08/02/2015 0835   AST 38 08/02/2015 0835   AST 25 06/15/2013 0735   ALT 31 08/02/2015 0835   ALT 20 06/15/2013 0735   ALKPHOS 74 08/02/2015 0835   ALKPHOS 58 06/15/2013 0735   BILITOT 0.7 08/02/2015 0835   BILITOT 0.8 06/15/2013 0735   PROT 7.1 08/02/2015 0835   PROT 7.0 06/15/2013 0735   ALBUMIN 4.3 08/02/2015 0835   ALBUMIN 4.1 06/15/2013 0735     Studies/Results: No  results found.  Anti-infectives: Anti-infectives    Start     Dose/Rate Route Frequency Ordered Stop   08/03/15 1131  vancomycin (VANCOCIN) IVPB 1000 mg/200 mL premix     1,000 mg 200 mL/hr over 60 Minutes Intravenous On call to O.R. 08/03/15 1131 08/03/15 1340      Assessment Principal Problem:   Gastric mass s/p excision 08/02/15-stable overnight   Chronic anemia    LOS: 1 day   Plan: D/C foley.  Ambulate.  Start liquid diet.   Laverna Dossett Lenna Sciara 08/04/2015

## 2015-08-05 LAB — CBC
HCT: 27.6 % — ABNORMAL LOW (ref 36.0–46.0)
Hemoglobin: 8.5 g/dL — ABNORMAL LOW (ref 12.0–15.0)
MCH: 23.9 pg — AB (ref 26.0–34.0)
MCHC: 30.8 g/dL (ref 30.0–36.0)
MCV: 77.7 fL — ABNORMAL LOW (ref 78.0–100.0)
PLATELETS: 268 10*3/uL (ref 150–400)
RBC: 3.55 MIL/uL — AB (ref 3.87–5.11)
RDW: 16.3 % — ABNORMAL HIGH (ref 11.5–15.5)
WBC: 8.4 10*3/uL (ref 4.0–10.5)

## 2015-08-05 MED ORDER — HYDROCODONE-ACETAMINOPHEN 5-325 MG PO TABS
1.0000 | ORAL_TABLET | ORAL | Status: DC | PRN
  Administered 2015-08-05 – 2015-08-06 (×2): 1 via ORAL
  Filled 2015-08-05: qty 2
  Filled 2015-08-05: qty 1

## 2015-08-05 NOTE — Progress Notes (Signed)
2 Days Post-Op  Subjective: Drinking a small amount of liquid.  Feels a little bloated.  Passing a little gas.    Objective: Vital signs in last 24 hours: Temp:  [98.1 F (36.7 C)-98.7 F (37.1 C)] 98.1 F (36.7 C) (07/23 0519) Pulse Rate:  [63-69] 69 (07/23 0519) Resp:  [16-20] 20 (07/23 0519) BP: (83-102)/(52-59) 93/54 (07/23 0519) SpO2:  [98 %-100 %] 98 % (07/23 0519) Last BM Date: 08/02/15  Intake/Output from previous day: 07/22 0701 - 07/23 0700 In: 2852.9 [P.O.:960; I.V.:1792.9; IV Piggyback:100] Out: 1570 [Urine:1570] Intake/Output this shift: No intake/output data recorded.  PE: General- In NAD Abdomen-soft, active bowel sounds, dressings dry  Lab Results:   Recent Labs  08/02/15 0835 08/04/15 0523  WBC 5.4 10.7*  HGB 10.1* 9.1*  HCT 32.9* 29.6*  PLT 380 316   BMET  Recent Labs  08/02/15 0835 08/04/15 0523  NA 139 136  K 4.3 4.1  CL 106 106  CO2 27 25  GLUCOSE 91 140*  BUN 13 9  CREATININE 0.62 0.52  CALCIUM 9.1 8.0*   PT/INR No results for input(s): LABPROT, INR in the last 72 hours. Comprehensive Metabolic Panel:    Component Value Date/Time   NA 136 08/04/2015 0523   NA 139 08/02/2015 0835   K 4.1 08/04/2015 0523   K 4.3 08/02/2015 0835   CL 106 08/04/2015 0523   CL 106 08/02/2015 0835   CO2 25 08/04/2015 0523   CO2 27 08/02/2015 0835   BUN 9 08/04/2015 0523   BUN 13 08/02/2015 0835   CREATININE 0.52 08/04/2015 0523   CREATININE 0.62 08/02/2015 0835   GLUCOSE 140 (H) 08/04/2015 0523   GLUCOSE 91 08/02/2015 0835   CALCIUM 8.0 (L) 08/04/2015 0523   CALCIUM 9.1 08/02/2015 0835   AST 38 08/02/2015 0835   AST 25 06/15/2013 0735   ALT 31 08/02/2015 0835   ALT 20 06/15/2013 0735   ALKPHOS 74 08/02/2015 0835   ALKPHOS 58 06/15/2013 0735   BILITOT 0.7 08/02/2015 0835   BILITOT 0.8 06/15/2013 0735   PROT 7.1 08/02/2015 0835   PROT 7.0 06/15/2013 0735   ALBUMIN 4.3 08/02/2015 0835   ALBUMIN 4.1 06/15/2013 0735      Studies/Results: No results found.  Anti-infectives: Anti-infectives    Start     Dose/Rate Route Frequency Ordered Stop   08/03/15 1131  vancomycin (VANCOCIN) IVPB 1000 mg/200 mL premix     1,000 mg 200 mL/hr over 60 Minutes Intravenous On call to O.R. 08/03/15 1131 08/03/15 1340      Assessment Principal Problem:   Gastric mass s/p excision 08/02/15-progressing well; BP slightly low at times but HR wnl and unchanged, uop good.   Chronic anemia    LOS: 2 days   Plan: Soft diet.  Oral analgesic.  Recheck CBC.   Kathryn Gregory Kathryn Gregory 08/05/2015

## 2015-08-06 LAB — CBC
HCT: 26.7 % — ABNORMAL LOW (ref 36.0–46.0)
Hemoglobin: 7.9 g/dL — ABNORMAL LOW (ref 12.0–15.0)
MCH: 23.3 pg — AB (ref 26.0–34.0)
MCHC: 29.6 g/dL — AB (ref 30.0–36.0)
MCV: 78.8 fL (ref 78.0–100.0)
Platelets: 259 10*3/uL (ref 150–400)
RBC: 3.39 MIL/uL — AB (ref 3.87–5.11)
RDW: 16.1 % — AB (ref 11.5–15.5)
WBC: 7 10*3/uL (ref 4.0–10.5)

## 2015-08-06 MED ORDER — FERROUS SULFATE 325 (65 FE) MG PO TABS: 325.0000 mg | ORAL_TABLET | Freq: Two times a day (BID) | ORAL | 1 refills | Status: DC

## 2015-08-06 MED ORDER — PANTOPRAZOLE SODIUM 40 MG PO TBEC: 40.0000 mg | DELAYED_RELEASE_TABLET | Freq: Two times a day (BID) | ORAL | 1 refills | Status: DC

## 2015-08-06 MED ORDER — HYDROCODONE-ACETAMINOPHEN 5-325 MG PO TABS: 1.0000 | ORAL_TABLET | ORAL | 0 refills | Status: DC | PRN

## 2015-08-06 NOTE — Progress Notes (Signed)
3 Days Post-Op  Subjective: Tolerating diet and passing gas.  Walking. Feels "great."  Wants to go home.  Objective: Vital signs in last 24 hours: Temp:  [98.4 F (36.9 C)-98.7 F (37.1 C)] 98.4 F (36.9 C) (07/24 0459) Pulse Rate:  [71-82] 81 (07/24 0459) Resp:  [18] 18 (07/24 0459) BP: (106-109)/(60-69) 109/60 (07/24 0459) SpO2:  [99 %-100 %] 99 % (07/24 0459) Last BM Date: 08/02/15  Intake/Output from previous day: 07/23 0701 - 07/24 0700 In: 1776.7 [P.O.:720; I.V.:881.7; IV Piggyback:100] Out: 2800 [Urine:2800] Intake/Output this shift: No intake/output data recorded.  PE: General- In NAD Abdomen-soft, active bowel sounds, incisions are clean and intact  Lab Results:   Recent Labs  08/05/15 0807 08/06/15 0005  WBC 8.4 7.0  HGB 8.5* 7.9*  HCT 27.6* 26.7*  PLT 268 259   BMET  Recent Labs  08/04/15 0523  NA 136  K 4.1  CL 106  CO2 25  GLUCOSE 140*  BUN 9  CREATININE 0.52  CALCIUM 8.0*   PT/INR No results for input(s): LABPROT, INR in the last 72 hours. Comprehensive Metabolic Panel:    Component Value Date/Time   NA 136 08/04/2015 0523   NA 139 08/02/2015 0835   K 4.1 08/04/2015 0523   K 4.3 08/02/2015 0835   CL 106 08/04/2015 0523   CL 106 08/02/2015 0835   CO2 25 08/04/2015 0523   CO2 27 08/02/2015 0835   BUN 9 08/04/2015 0523   BUN 13 08/02/2015 0835   CREATININE 0.52 08/04/2015 0523   CREATININE 0.62 08/02/2015 0835   GLUCOSE 140 (H) 08/04/2015 0523   GLUCOSE 91 08/02/2015 0835   CALCIUM 8.0 (L) 08/04/2015 0523   CALCIUM 9.1 08/02/2015 0835   AST 38 08/02/2015 0835   AST 25 06/15/2013 0735   ALT 31 08/02/2015 0835   ALT 20 06/15/2013 0735   ALKPHOS 74 08/02/2015 0835   ALKPHOS 58 06/15/2013 0735   BILITOT 0.7 08/02/2015 0835   BILITOT 0.8 06/15/2013 0735   PROT 7.1 08/02/2015 0835   PROT 7.0 06/15/2013 0735   ALBUMIN 4.3 08/02/2015 0835   ALBUMIN 4.1 06/15/2013 0735     Studies/Results: No results  found.  Anti-infectives: Anti-infectives    Start     Dose/Rate Route Frequency Ordered Stop   08/03/15 1131  vancomycin (VANCOCIN) IVPB 1000 mg/200 mL premix     1,000 mg 200 mL/hr over 60 Minutes Intravenous On call to O.R. 08/03/15 1131 08/03/15 1340      Assessment Principal Problem:   Gastric mass s/p excision 08/02/15- continues to progress well;    Chronic anemia with ABL anemia-hemoglobin has been drifting down but she is asymptomatic with normal VS for past 24 hours ; no clinical evidence of bleeding; may be somewhat dilutional.    LOS: 3 days   Plan: Discharge today on Iron, Protonix BID, Norco for pain.  Check CBC tomorrow as outpatient.  Follow up in office in 3 weeks.   Kathryn Gregory 08/06/2015

## 2015-08-06 NOTE — Discharge Instructions (Signed)
[facility], [place] [phone]  ABDOMINAL SURGERY: POST OP INSTRUCTIONS  Always review your discharge instruction sheet given to you by the facility where your surgery was performed.  IF YOU HAVE DISABILITY OR FAMILY LEAVE FORMS, YOU MUST BRING THEM TO THE OFFICE FOR PROCESSING.  PLEASE DO NOT GIVE THEM TO YOUR DOCTOR.  1. A prescription for pain medication may be given to you upon discharge.  Take your pain medication as prescribed, if needed.  If narcotic pain medicine is not needed, then you may take acetaminophen (Tylenol) or ibuprofen (Advil) as needed. 2. Take your usually prescribed medications unless otherwise directed. 3. If you need a refill on your pain medication, please contact your pharmacy. They will contact our office to request authorization.  Prescriptions will not be filled after 5pm or on week-ends. 4. You should follow a soft diet.  Do not overeat.   Be sure to include lots of fluids daily. Most patients will experience some swelling and bruising in the area of the incision. Ice pack will help. Swelling and bruising can take several days to resolve..  5. It is common to experience some constipation if taking pain medication after surgery.  Increasing fluid intake and taking a stool softener will usually help or prevent this problem from occurring.  A mild laxative (Milk of Magnesia or Miralax) should be taken according to package directions if there are no bowel movements after 48 hours. 6.  You may have steri-strips (small skin tapes) in place directly over the incision.  These strips should be left on the skin.  If your surgeon used skin glue on the incision, you may shower in 24 hours.  The glue will flake off over the next 2-3 weeks.   You may shower daily. 7. ACTIVITIES:  You may resume regular (light) daily activities beginning the next day--such as daily self-care, walking, climbing stairs--gradually increasing activities as tolerated.  You may have  sexual intercourse when it is comfortable.  Refrain from any heavy lifting or straining-nothing over 10 pounds for 6 weeks.  a. You may drive when you no longer are taking prescription pain medication, you can comfortably wear a seatbelt, and you can safely maneuver your car and apply brakes b. Return to Work: _When released by MD__________________________________ 8. You should see your doctor in the office for a follow-up appointment approximately 2-3 weeks after your surgery.  Make sure that you call for this appointment within a day or two after you arrive home to insure a convenient appointment time. OTHER INSTRUCTIONS:  _Go to lab tomorrow and have blood drawn to check blood count-[facility], [address].____________________________________________________________ _____________________________________________________________  WHEN TO CALL YOUR DOCTOR: 1. Fever over 101.0 2. Inability to urinate 3. Nausea and/or vomiting 4. Extreme swelling or bruising 5. Continued bleeding from incision. 6. Increased pain, redness, or drainage from the incision. 7. Difficulty swallowing or breathing 8. Muscle cramping or spasms. 9. Numbness or tingling in hands or feet or around lips.  The clinic staff is available to answer your questions during regular business hours.  Please dont hesitate to call and ask to speak to one of the nurses if you have concerns.  For further questions, please visit [email or web address]

## 2015-08-07 DIAGNOSIS — D649 Anemia, unspecified: Secondary | ICD-10-CM | POA: Diagnosis not present

## 2015-08-21 NOTE — Discharge Summary (Signed)
Physician Discharge Summary  Patient ID: Kathryn Gregory MRN: [id number] DOB/AGE: April 11, 1965 50 y.o.  Admit date: 08/03/2015 Discharge date: 08/06/2015  Admission Diagnoses:  Gastric mass  Discharge Diagnoses:  Principal Problem:   Gastric mass s/p excision 08/02/15   Discharged Condition: good  Hospital Course: She underwent the above procedure and tolerated it well. Her diet was slowly advanced, her bowel function returned, pathology demonstrated a inflammatory fibroid polyp with a small dark red area consistent with a bleeding source.  She was able to discharged on POD #3.  She did have acute ABL anemia with lowest hemoglobin of 7.9 but was asymptomatic from this.     Discharge Exam: Blood pressure 109/60, pulse 81, temperature 98.4 F (36.9 C), temperature source Oral, resp. rate 18, height 5\' 5"  (1.651 m), weight 76 kg (167 lb 8.8 oz), last menstrual period 07/06/2015, SpO2 99 %.   Disposition: 01-Home or Self Care     Medication List    TAKE these medications   ferrous sulfate 325 (65 FE) MG tablet Commonly known as:  FERROUSUL Take 1 tablet (325 mg total) by mouth 2 (two) times daily with a meal.   HYDROcodone-acetaminophen 5-325 MG tablet Commonly known as:  NORCO/VICODIN Take 1-2 tablets by mouth every 4 (four) hours as needed for moderate pain.   levothyroxine 112 MCG tablet Commonly known as:  SYNTHROID, LEVOTHROID Take 1 tablet (112 mcg total) by mouth daily.   OVER THE COUNTER MEDICATION Take 2 tablets by mouth daily. Magnesium , b6, b12, folvite, vitamin C 10 mg   pantoprazole 40 MG tablet Commonly known as:  PROTONIX Take 1 tablet (40 mg total) by mouth 2 (two) times daily.        Signed: Odis Hollingshead 08/21/2015, 9:58 AM

## 2015-08-27 ENCOUNTER — Ambulatory Visit (INDEPENDENT_AMBULATORY_CARE_PROVIDER_SITE_OTHER): Payer: BLUE CROSS/BLUE SHIELD | Admitting: Nurse Practitioner

## 2015-08-27 ENCOUNTER — Telehealth: Payer: Self-pay | Admitting: Obstetrics & Gynecology

## 2015-08-27 ENCOUNTER — Encounter: Payer: Self-pay | Admitting: Nurse Practitioner

## 2015-08-27 VITALS — BP 122/80 | HR 68 | Ht 66.25 in | Wt 155.0 lb

## 2015-08-27 DIAGNOSIS — N921 Excessive and frequent menstruation with irregular cycle: Secondary | ICD-10-CM | POA: Diagnosis not present

## 2015-08-27 DIAGNOSIS — N926 Irregular menstruation, unspecified: Secondary | ICD-10-CM

## 2015-08-27 LAB — CBC WITH DIFFERENTIAL/PLATELET
BASOS ABS: 62 {cells}/uL (ref 0–200)
Basophils Relative: 1 %
EOS ABS: 186 {cells}/uL (ref 15–500)
Eosinophils Relative: 3 %
HEMATOCRIT: 35.1 % (ref 35.0–45.0)
HEMOGLOBIN: 11 g/dL — AB (ref 11.7–15.5)
LYMPHS ABS: 2046 {cells}/uL (ref 850–3900)
MCH: 25.4 pg — ABNORMAL LOW (ref 27.0–33.0)
MCHC: 31.3 g/dL — AB (ref 32.0–36.0)
MCV: 81.1 fL (ref 80.0–100.0)
MONO ABS: 558 {cells}/uL (ref 200–950)
MPV: 9.3 fL (ref 7.5–12.5)
Monocytes Relative: 9 %
NEUTROS ABS: 3348 {cells}/uL (ref 1500–7800)
NEUTROS PCT: 54 %
Platelets: 330 10*3/uL (ref 140–400)
RBC: 4.33 MIL/uL (ref 3.80–5.10)
RDW: 20 % — AB (ref 11.0–15.0)
WBC: 6.2 10*3/uL (ref 3.8–10.8)

## 2015-08-27 LAB — HEMOGLOBIN, FINGERSTICK: Hemoglobin, fingerstick: 10.4 g/dL — ABNORMAL LOW (ref 12.0–16.0)

## 2015-08-27 MED ORDER — MEDROXYPROGESTERONE ACETATE 10 MG PO TABS: 10.0000 mg | ORAL_TABLET | Freq: Every day | ORAL | 0 refills | Status: DC

## 2015-08-27 NOTE — Progress Notes (Signed)
50 y.o. Married Caucasian female G2P1102 here for concerns about menorrhagia.  She underwent a laparoscopic gastrotomy with excision of a gastric polyp on 08/03/15.  Her cycle was due about the time of surgery.  Previous menses about mid to late June and was normal.  She has had BTL.  This cycle started on 7/7/ and was very heavy, lighter on Tuesday, then spotting Wed & Thursday.  Then by Friday to current very heavy flow with passing a lot of clots.  She has concerns since her iron count was so low from having the gastric polyps.  She is on iron supplements -  Ferrous Sulfate.  She denies light headiness, heart palpitations, fatigue, or weakness.  She is going out of town to [place] on a business meeting tomorrow am and would like to slow down or stop her flow to be manageable for the trip.    O: Healthy WD,WN female Affect: no distress Skin: dry Abdomen:soft, with mid abdomen incision area that is healing.  Steri strips in place.  Tender with change of positions. Pelvic exam:EXTERNAL GENITALIA: normal appearing vulva with no masses, tenderness or lesions VAGINA:  Moderate to heavy vaginal bleeding - no clotting  CERVIX: no lesions or cervical motion tenderness and normal bloody discharge  UTERUS: small and non tender ADNEXA: no masses palpable and non tender  HGB POCT= 10.4   (last CBC 7/24= 7.9)    A: Menorrhagia with irregular cycle  S/P Lap Gastrotomy with benign polyp 08/03/15  Anemia     P:  Will give her Provera 10 mg X 7 days and she is aware she may also have a withdrawal bleed - to call if very heavy or prolonged  Repeat CBC to the lab and will follow   Labs   Instructions given regarding: call if pain , light headiness, or heavy bleeding  RV

## 2015-08-27 NOTE — Telephone Encounter (Signed)
Spoke with patient. Patient states that she had abdominal surgery on 08/03/2015 with Dr.Rosenbower. Reports she was due to start her menses that week, but did not. On 08/20/2015 she started having light bleeding with clotting. On 08/21/2015 bleeding decreased and stopped. On Friday 08/24/2015 she began having increased bleeding with clotting. Reports she is now changing her pad every 2 hours. Denies SOB, dizziness, fatigue, or feeling light headed. States she had a low hemoglobin of 7.9 following surgery and was started on ferrous sulfate BID. Reports she is scheduled to follow up with Dr.Rosenbower on 08/29/2015. She is concerned regarding abnormal uterine bleeding. Advised she will need to be seen in the office today for further evaluation. She is agreeable. Appointment scheduled for today at 12:45 pm with Kem Boroughs, FNP. She is agreeable to date and time.  Routing to provider for final review. Patient agreeable to disposition. Will close encounter.

## 2015-08-27 NOTE — Patient Instructions (Signed)

## 2015-08-27 NOTE — Telephone Encounter (Signed)
Patient said "I am bleeding too much, not sure if I need an appointment?"

## 2015-08-28 NOTE — Progress Notes (Signed)
Reviewed personally.  M. Suzanne Tc Kapusta, MD.  

## 2015-08-30 ENCOUNTER — Telehealth: Payer: Self-pay | Admitting: Nurse Practitioner

## 2015-08-30 ENCOUNTER — Encounter: Payer: Self-pay | Admitting: Nurse Practitioner

## 2015-08-30 NOTE — Telephone Encounter (Signed)
Spoke with patient. Advised I have reviewed with Dr.Miller. Dr.Miller recommends that the patient be seen for further evaluation with an EMB. Patient is agreeable and verbalizes understanding. Appointment scheduled for tomorrow 08/31/2015 at 12:45 pm with Dr.Miller. She is agreeable to date and time. Advised to continue taking Provera 10 mg daily at this time. Pre procedure instructions given.  Motrin instructions given. Motrin=Advil=Ibuprofen, 800 mg one hour before appointment. Eat a meal and hydrate well before appointment. Order placed for precert.  Cc: Theresia Lo  Routing to provider for final review. Patient agreeable to disposition. Will close encounter.

## 2015-08-30 NOTE — Telephone Encounter (Signed)
Please see telephone encounter dated with today's date. 

## 2015-08-30 NOTE — Telephone Encounter (Signed)
Please see e-mail message to [facility], Thank you for seeing me on Monday. No sure if the medication is working as expected. Here is what is going on: Monday took one around 2 p.m.Kathryn KitchenMarland Kitchenperiod started slowing down Tuesday took one around 5.30 a.m.Kathryn Gregory period almost stopped around 8 a .mjust spotted during the day(thank goodness) Wednesday period started around 5: 00 a.m took a pill and the bleeding slowed downa lot but did not stop. Thursday 2.30 a.m heavy period start with big blood clots. Took a pill at 6.15 a.m.  Not sure if that is the way the medication works. Please feel free to call me at my cellphone [phone]. I'll be working in [place] today. Thank you so much. I appreciate your feedback Have a great day. [employer]

## 2015-08-30 NOTE — Telephone Encounter (Addendum)
Spoke with patient. Patient was seen in the office on Monday 08/27/2015 for irregular heavy bleeding (see OV note). She was started on Provera 10 mg x 10 days. Reports she have been taking 1 pill per day every day around 5:30 am - 6:30 am. Reports she continues to have bleeding. The only day she did not have any bleeding was Tuesday 08/28/2015. Reports she took her pill today at 6:30 am. Reports she is changing her pad every 2-3 hours. Is passing clots that are the size of a "ping pong ball." Denies fatigue, SOB, dizziness, or feeling light headed. Patient is asking what she should do at this time to help stop bleeding. Advised I will speak with covering provider and return call with further recommendations. She is agreeable.  Routing to [place] as she has seen this patient in the past.

## 2015-08-31 ENCOUNTER — Other Ambulatory Visit: Payer: Self-pay | Admitting: *Deleted

## 2015-08-31 ENCOUNTER — Ambulatory Visit (INDEPENDENT_AMBULATORY_CARE_PROVIDER_SITE_OTHER): Payer: BLUE CROSS/BLUE SHIELD | Admitting: Obstetrics & Gynecology

## 2015-08-31 ENCOUNTER — Encounter: Payer: Self-pay | Admitting: Obstetrics & Gynecology

## 2015-08-31 VITALS — BP 92/54 | HR 78 | Resp 14 | Ht 66.0 in | Wt 154.6 lb

## 2015-08-31 DIAGNOSIS — N921 Excessive and frequent menstruation with irregular cycle: Secondary | ICD-10-CM

## 2015-08-31 DIAGNOSIS — N920 Excessive and frequent menstruation with regular cycle: Secondary | ICD-10-CM | POA: Diagnosis not present

## 2015-08-31 MED ORDER — NORETHINDRONE ACETATE 5 MG PO TABS: ORAL_TABLET | ORAL | 0 refills | Status: DC

## 2015-08-31 NOTE — Progress Notes (Signed)
GYNECOLOGY  VISIT   HPI: 50 y.o. [id number] Married [name] female here with complaint of heavy vaginal bleeding with cycle this month.  Pt skipped cycle last month which was right around the time she underwent a laparoscopic gastrotomy with excision of gastric tumor.  Pathology was a polyp.  With onset of cycle this month, flow was very, very heavy with clots.  She was seen on Monday by [place] and started on Provera.  Pt took the first on on Monday afternoon and then the second one on Tuesday morning.  She had almost no bleeding on Tues but then it started back Wednesday and yesterday morning, flow was heavy again with clots.  Today bleeding has decreased.  She denies SOB, palpitation, light headedness.  Due to bleeding, I have recommended endometrial biopsy for additional evaluation.   Has been a stressful summer.  Pt's mother died and she has been back to [place] several times.  Does not want to talk about this today as she does not want to cry while she is here for OV.  GYNECOLOGIC HISTORY: Patient's last menstrual period was 08/20/2015 (exact date). Contraception: BTL  Patient Active Problem List   Diagnosis Date Noted  . Gastric mass s/p excision 08/02/15 08/03/2015  . Ventral hernia 05/01/2014  . Routine general medical examination at a health care facility 05/01/2014  . Postablative hypothyroidism 09/06/2009  . ANEMIA-NOS 05/01/2009  . HEARING LOSS 05/01/2009  . GERD 05/01/2009  . ROSACEA 05/01/2009  . LOW BACK PAIN, CHRONIC 05/01/2009    Past Medical History:  Diagnosis Date  . ANEMIA-NOS   . GERD   . IBS (irritable bowel syndrome)   . Lumbar herniated disc    not a problem now  . Other postablative hypothyroidism 07/2009   I-131ablation 07/2009 for hyperthyroid  . Rosacea     Past Surgical History:  Procedure Laterality Date  . EUS N/A 05/04/2015   Procedure: UPPER ENDOSCOPIC ULTRASOUND (EUS) LINEAR;  Surgeon: Carol Ada, MD;  Location: [facility];   Service: Endoscopy;  Laterality: N/A;  . EUS N/A 06/15/2015   Procedure: UPPER ENDOSCOPIC ULTRASOUND (EUS) LINEAR;  Surgeon: Carol Ada, MD;  Location: [facility];  Service: Endoscopy;  Laterality: N/A;  . LAPAROSCOPIC GASTRIC RESECTION N/A 08/03/2015   Procedure: LAPAROSCOPIC GASTROTOMY WITH EXCISION OF  GASTIC TUMOR;  Surgeon: Jackolyn Confer, MD;  Location: [facility];  Service: General;  Laterality: N/A;  . TONSILLECTOMY    . TUBAL LIGATION  1998    MEDS:  Reviewed in EPIC and UTD  ALLERGIES: Penicillins  Family History  Problem Relation Age of Onset  . Mental illness Mother   . Lung disease Mother     lung fibrosis  . Breast cancer Maternal Aunt   . Colon cancer Paternal Uncle   . Dementia Maternal Grandmother   . Breast cancer Cousin 86  . Cancer Other     cancer of tongue  . Diabetes Father 50  . Multiple births Sister     3 sets of twins  . Cancer Maternal Uncle     unsure type  . Diabetes Paternal Grandmother     SH:  Married, non smoker  Review of Systems  All other systems reviewed and are negative.   PHYSICAL EXAMINATION:    BP (!) 92/54 (BP Location: Right Arm, Patient Position: Sitting)   Pulse 78   Resp 14   Ht 5\' 6"  (1.676 m)   Wt 154 lb 9.6 oz (70.1 kg)   LMP 08/20/2015 (Exact  Date)   BMI 24.95 kg/m     General appearance: alert, cooperative and appears stated age CV:  Regular rate and rhythm Lungs:  clear to auscultation, no wheezes, rales or rhonchi, symmetric air entry Abdomen: soft, non-tender; bowel sounds normal; no masses,  no organomegaly, new scars present but healing well  Pelvic: External genitalia:  no lesions              Urethra:  normal appearing urethra with no masses, tenderness or lesions              Bartholins and Skenes: normal                 Vagina: normal appearing vagina with normal color and discharge, no lesions              Cervix: no lesions              Bimanual Exam:  Uterus:  normal size, contour, position,  consistency, mobility, non-tender              Adnexa: no mass, fullness, tenderness              Pt declines rectal exam  Endometrial biopsy recommended.  Discussed with patient.  Verbal and written consent obtained.   Procedure:  Speculum placed.  Cervix visualized and cleansed with betadine prep.  A single toothed tenaculum was applied to the anterior lip of the cervix.  Endometrial pipelle was advanced through the cervix into the endometrial cavity without difficulty.  Pipelle passed to 7.5cm.  Suction applied and pipelle removed with good tissue sample obtained.  Three passes performed due to pipelle being filled with blood.  Procedure stopped due to discomfort.  Tenculum removed.  No bleeding noted.  Patient tolerated procedure well.   Chaperone was present for exam.  Assessment: Menorrhagia after skipping cycle in July  Plan: Endometrial biopsy pending Aygestin 10mg  bid until no bleeding x 2 days.  Then decrease to 10mg  daily.  Will have biopsy by that time and will make further recommendations regarding tapering off aygestin.

## 2015-09-05 ENCOUNTER — Telehealth: Payer: Self-pay | Admitting: *Deleted

## 2015-09-05 NOTE — Telephone Encounter (Signed)
-----   Message from Megan Salon, MD sent at 09/04/2015  5:18 PM EDT ----- Please let pt know her biopsy is negative for abnormal cells.  Can you please check on her bleeding and see how she is doing?  Thanks.

## 2015-09-05 NOTE — Telephone Encounter (Signed)
Call to patient. Left message to call back, ask for [employer] or Gay Filler.

## 2015-09-06 NOTE — Telephone Encounter (Signed)
Patient is returning a call. °

## 2015-09-06 NOTE — Telephone Encounter (Signed)
Spoke with patient. Advised of results as seen below from [place]. Patient is agreeable and verbalizes understanding. Reports she has not been bleeding for 2 days and is going to reduce her Progesterone to 1 tablet per day today. Reports she is feeling much better. She will contact the office if bleeding returns or if she has any questions/concerns.  Routing to provider for final review. Patient agreeable to disposition. Will close encounter.

## 2015-09-11 ENCOUNTER — Telehealth: Payer: Self-pay | Admitting: Obstetrics & Gynecology

## 2015-09-11 ENCOUNTER — Other Ambulatory Visit: Payer: Self-pay | Admitting: Obstetrics & Gynecology

## 2015-09-11 MED ORDER — NORETHINDRONE ACETATE 5 MG PO TABS: 10.0000 mg | ORAL_TABLET | Freq: Every day | ORAL | 0 refills | Status: DC

## 2015-09-11 NOTE — Telephone Encounter (Signed)
Ok to continue 10mg  daily.  Rx for #30 to pharmacy.  Pt can take until she returns and then stop.  She will have a withdrawal bleed but will hopefully be better than the last one.

## 2015-09-11 NOTE — Telephone Encounter (Signed)
Patient called to check on the status of the call. 

## 2015-09-11 NOTE — Telephone Encounter (Signed)
Spoke with patient. Advised of message as seen below from [place]. Patient is agreeable and verbalizes understanding. Rx for Aygestin 10 mg daily #30 0RF sent to pharmacy on file. Aware to expect a withdrawal bleed after stopping her medication upon return from her trip. Advised if she has heavy bleeding to where she is having to change her pad/tampon every hour due to bleeding through or have prolonged bleeding will need to be seen for immediate evaluation. She is agreeable.  Routing to provider for final review. Patient agreeable to disposition. Will close encounter.

## 2015-09-11 NOTE — Telephone Encounter (Signed)
Spoke with patient. Patient was seen in the office on 8/18/207 with Dr.Miller for evaluation of menorrhagia with irregular cycle. EMB was performed which returned negative. She was started on Aygestin 10 mg twice per day until bleeding stops then 10 mg daily. Reports she started taking 10 mg daily on 09/05/2015. Patient started having light spotting and mild cramping last night. Patient has 3 Aygestin tablets left. She will be going out of town tomorrow until 09/20/2015. Patient is concerned about having bleeding on her trip. Advised I will speak with Dr.Mller and return call with further recommendations. She is agreeable.

## 2015-09-11 NOTE — Telephone Encounter (Signed)
Patient called and said, "I am going on a trip tomorrow morning but I am having spotting again. I'd like to talk to [facility]."  Last seen: 08/31/15

## 2015-09-11 NOTE — Telephone Encounter (Signed)
Routing to Dr.Miller for review and advise. 

## 2015-10-18 ENCOUNTER — Ambulatory Visit (INDEPENDENT_AMBULATORY_CARE_PROVIDER_SITE_OTHER): Payer: BLUE CROSS/BLUE SHIELD | Admitting: Internal Medicine

## 2015-10-18 ENCOUNTER — Encounter: Payer: Self-pay | Admitting: Internal Medicine

## 2015-10-18 DIAGNOSIS — B351 Tinea unguium: Secondary | ICD-10-CM | POA: Insufficient documentation

## 2015-10-18 MED ORDER — TERBINAFINE HCL 250 MG PO TABS: 250.0000 mg | ORAL_TABLET | Freq: Every day | ORAL | 0 refills | Status: DC

## 2015-10-18 MED ORDER — HYDROCORTISONE 1 % EX OINT: 1.0000 "application " | TOPICAL_OINTMENT | Freq: Two times a day (BID) | CUTANEOUS | 0 refills | Status: DC

## 2015-10-18 NOTE — Assessment & Plan Note (Addendum)
Rx for lamisil 1 week therapy, 3 weeks off for 3 months. We talked about different options for therapy and success rates.

## 2015-10-18 NOTE — Patient Instructions (Signed)
We have sent in lamisil (terbinafine) for the toenails.   Take 1 pill daily for 1 week. (10/18/15-10/25/15).   Then stop for 3 weeks.   Then take 1 pill daily for 1 week (11/15/15-11/22/15).  Then stop for 3 weeks.   Then take 1 pill daily for 1 week (12/13/15-12/20/15).   We have also sent in the cream for your face that you can use twice a day. Dab a small amount on the dry area. Do not use for more than 2 weeks on the face.

## 2015-10-18 NOTE — Progress Notes (Signed)
   Subjective:    Patient ID: Kathryn Gregory, female    DOB: 1965-04-25, 50 y.o.   MRN: [id number]  HPI The patient is a 50 YO female coming in for possible toenail fungus on her toes and a dry patch of skin on her forehead. She noticed the toenails in the last 6 months and their appearance is distracting. She has tried creams over the counter which have not helped. She is worried about people noticing them. She also has a dry skin patch on her forehead which is bothering her that people are noticing.    Review of Systems  Constitutional: Negative.   Respiratory: Negative.   Cardiovascular: Negative.   Gastrointestinal: Negative.   Musculoskeletal: Negative.   Skin: Positive for color change and rash. Negative for pallor and wound.      Objective:   Physical Exam  Constitutional: She is oriented to person, place, and time. She appears well-developed and well-nourished.  HENT:  Head: Normocephalic and atraumatic.  Cardiovascular: Normal rate and regular rhythm.   Pulmonary/Chest: Effort normal and breath sounds normal.  Abdominal: Soft.  Musculoskeletal: She exhibits no edema.  Neurological: She is alert and oriented to person, place, and time.  Skin: Skin is warm and dry.  Right foot with 2-4th toes with fungus, dry patch of skin on the left upper forehead at the scalp line   Vitals:   10/18/15 0834  BP: 98/60  Pulse: 88  Resp: 12  Temp: 98.7 F (37.1 C)  TempSrc: Oral  SpO2: 99%  Weight: 152 lb (68.9 kg)  Height: 5\' 5"  (1.651 m)      Assessment & Plan:

## 2015-10-18 NOTE — Progress Notes (Signed)
Pre visit review using our clinic review tool, if applicable. No additional management support is needed unless otherwise documented below in the visit note. 

## 2015-10-20 ENCOUNTER — Other Ambulatory Visit: Payer: Self-pay | Admitting: Internal Medicine

## 2016-05-07 NOTE — Progress Notes (Signed)
51 y.o. [id number] MarriedHispanicF here for annual exam.  Reports cycles have improved.  Flow has been regular and typically lasts two or three days.  Had episode last September where flow was really heavy after missing the prior month's cycle.  She has not a cycle like that since.  Mother passed last year.  She is trying to get her father to move.  Family is supportive.  PCP:  Dr. Sharlet Salina  Patient's last menstrual period was 03/27/2016.          Sexually active: Yes.    The current method of family planning is tubal ligation.    Exercising: Yes.    walking Smoker:  no  Health Maintenance: Pap:  01/25/15 neg. HR HPV:neg   04/09/11 Neg. HR HPV:neg  History of abnormal Pap:  no  MMG:  02/19/15 [place] Left [place] and Bx: Negative  Colonoscopy:  02/23/15 Polyp, Dr. Collene Mares BMD:   none Td:  01/14/2004 Pneumonia vaccine(s):  none Zostavax:   none Hep C testing: not indicated Screening Labs: obtained today   reports that she has quit smoking. She quit after 10.00 years of use. She has never used smokeless tobacco. She reports that she drinks about 0.6 oz of alcohol per week . She reports that she does not use drugs.  Past Medical History:  Diagnosis Date  . ANEMIA-NOS   . GERD   . IBS (irritable bowel syndrome)   . Lumbar herniated disc    not a problem now  . Other postablative hypothyroidism 07/2009   I-131ablation 07/2009 for hyperthyroid  . Rosacea     Past Surgical History:  Procedure Laterality Date  . EUS N/A 05/04/2015   Procedure: UPPER ENDOSCOPIC ULTRASOUND (EUS) LINEAR;  Surgeon: Carol Ada, MD;  Location: [facility];  Service: Endoscopy;  Laterality: N/A;  . EUS N/A 06/15/2015   Procedure: UPPER ENDOSCOPIC ULTRASOUND (EUS) LINEAR;  Surgeon: Carol Ada, MD;  Location: [facility];  Service: Endoscopy;  Laterality: N/A;  . LAPAROSCOPIC GASTRIC RESECTION N/A 08/03/2015   Procedure: LAPAROSCOPIC GASTROTOMY WITH EXCISION OF  GASTIC TUMOR;  Surgeon: Jackolyn Confer, MD;  Location: [facility];   Service: General;  Laterality: N/A;  . TONSILLECTOMY    . TUBAL LIGATION  1998    Current Outpatient Prescriptions  Medication Sig Dispense Refill  . levothyroxine (SYNTHROID, LEVOTHROID) 112 MCG tablet TAKE ONE TABLET BY MOUTH ONCE DAILY 90 tablet 2   No current facility-administered medications for this visit.     Family History  Problem Relation Age of Onset  . Mental illness Mother   . Lung disease Mother     lung fibrosis  . Breast cancer Maternal Aunt   . Diabetes Father 59  . Colon cancer Paternal Uncle   . Dementia Maternal Grandmother   . Breast cancer Cousin 4  . Cancer Other     cancer of tongue  . Multiple births Sister     3 sets of twins  . Cancer Maternal Uncle     unsure type  . Diabetes Paternal Grandmother     ROS:  Pertinent items are noted in HPI.  Otherwise, a comprehensive ROS was negative.  Exam:   BP 118/70 (BP Location: Right Arm, Patient Position: Sitting, Cuff Size: Normal)   Pulse 68   Resp 16   Ht 5\' 6"  (1.676 m)   Wt 151 lb (68.5 kg)   LMP 03/27/2016   BMI 24.37 kg/m   Weight change: -16# Height: 5\' 6"  (167.6 cm)  Ht Readings  from Last 3 Encounters:  05/08/16 5\' 6"  (1.676 m)  10/18/15 5\' 5"  (1.651 m)  08/31/15 5\' 6"  (1.676 m)    General appearance: alert, cooperative and appears stated age Head: Normocephalic, without obvious abnormality, atraumatic Neck: no adenopathy, supple, symmetrical, trachea midline and thyroid normal to inspection and palpation Lungs: clear to auscultation bilaterally Breasts: normal appearance, no masses or tenderness Heart: regular rate and rhythm Abdomen: soft, non-tender; bowel sounds normal; no masses,  no organomegaly Extremities: extremities normal, atraumatic, no cyanosis or edema Skin: Skin color, texture, turgor normal. No rashes or lesions Lymph nodes: Cervical, supraclavicular, and axillary nodes normal. No abnormal inguinal nodes palpated Neurologic: Grossly normal   Pelvic: External  genitalia:  no lesions              Urethra:  normal appearing urethra with no masses, tenderness or lesions              Bartholins and Skenes: normal                 Vagina: normal appearing vagina with normal color and discharge, no lesions              Cervix: no lesions              Pap taken: No. Bimanual Exam:  Uterus:  normal size, contour, position, consistency, mobility, non-tender              Adnexa: normal adnexa and no mass, fullness, tenderness               Rectovaginal: Confirms               Anus:  normal sphincter tone, no lesions  Chaperone was present for exam.  A:  Well Woman with normal exam H/O menorrhagia that has improved H/O anemia IBS Hypothyroidism due to thyroid ablation Family hx of breast cancer in maternal aunt  P:   Mammogram guidelines reviewed.  Pt aware she is overdue pap smear and HR HPV neg 1/17.  Not obtained today Lab work will be obtained today:  CBC, CMP, TSH, VIt D, Lipids Pt will need refill completed for thyroid medication Aware tdap due.  Pt declines.  Aware to get with any exposure. Return annually or prn

## 2016-05-08 ENCOUNTER — Ambulatory Visit (INDEPENDENT_AMBULATORY_CARE_PROVIDER_SITE_OTHER): Payer: BLUE CROSS/BLUE SHIELD | Admitting: Obstetrics & Gynecology

## 2016-05-08 ENCOUNTER — Other Ambulatory Visit: Payer: Self-pay | Admitting: Obstetrics & Gynecology

## 2016-05-08 ENCOUNTER — Encounter: Payer: Self-pay | Admitting: Obstetrics & Gynecology

## 2016-05-08 VITALS — BP 118/70 | HR 68 | Resp 16 | Ht 66.0 in | Wt 151.0 lb

## 2016-05-08 DIAGNOSIS — E559 Vitamin D deficiency, unspecified: Secondary | ICD-10-CM

## 2016-05-08 DIAGNOSIS — Z0000 Encounter for general adult medical examination without abnormal findings: Secondary | ICD-10-CM

## 2016-05-08 DIAGNOSIS — D500 Iron deficiency anemia secondary to blood loss (chronic): Secondary | ICD-10-CM

## 2016-05-08 DIAGNOSIS — Z01419 Encounter for gynecological examination (general) (routine) without abnormal findings: Secondary | ICD-10-CM

## 2016-05-08 LAB — CBC
HEMATOCRIT: 33.1 % — AB (ref 35.0–45.0)
Hemoglobin: 9.9 g/dL — ABNORMAL LOW (ref 11.7–15.5)
MCH: 22.9 pg — ABNORMAL LOW (ref 27.0–33.0)
MCHC: 29.9 g/dL — AB (ref 32.0–36.0)
MCV: 76.4 fL — AB (ref 80.0–100.0)
MPV: 9.2 fL (ref 7.5–12.5)
PLATELETS: 371 10*3/uL (ref 140–400)
RBC: 4.33 MIL/uL (ref 3.80–5.10)
RDW: 16.7 % — ABNORMAL HIGH (ref 11.0–15.0)
WBC: 6.4 10*3/uL (ref 3.8–10.8)

## 2016-05-08 LAB — COMPREHENSIVE METABOLIC PANEL
ALT: 28 U/L (ref 6–29)
AST: 29 U/L (ref 10–35)
Albumin: 4.2 g/dL (ref 3.6–5.1)
Alkaline Phosphatase: 73 U/L (ref 33–130)
BUN: 15 mg/dL (ref 7–25)
CALCIUM: 9 mg/dL (ref 8.6–10.4)
CO2: 23 mmol/L (ref 20–31)
Chloride: 104 mmol/L (ref 98–110)
Creat: 0.64 mg/dL (ref 0.50–1.05)
GLUCOSE: 85 mg/dL (ref 65–99)
POTASSIUM: 4.4 mmol/L (ref 3.5–5.3)
Sodium: 139 mmol/L (ref 135–146)
Total Bilirubin: 0.3 mg/dL (ref 0.2–1.2)
Total Protein: 6.8 g/dL (ref 6.1–8.1)

## 2016-05-08 LAB — LIPID PANEL
CHOL/HDL RATIO: 2.5 ratio (ref <5.0)
Cholesterol: 176 mg/dL (ref <200)
HDL: 71 mg/dL (ref >50)
LDL CALC: 92 mg/dL (ref <100)
Triglycerides: 65 mg/dL (ref <150)
VLDL: 13 mg/dL (ref <30)

## 2016-05-08 LAB — TSH: TSH: 1.79 m[IU]/L

## 2016-05-09 LAB — VITAMIN D 25 HYDROXY (VIT D DEFICIENCY, FRACTURES): VIT D 25 HYDROXY: 25 ng/mL — AB (ref 30–100)

## 2016-05-10 NOTE — Addendum Note (Signed)
Addended by: Megan Salon on: 05/10/2016 07:12 AM   Modules accepted: Orders

## 2016-05-12 ENCOUNTER — Telehealth: Payer: Self-pay | Admitting: *Deleted

## 2016-05-12 LAB — FERRITIN: Ferritin: 7 ng/mL — ABNORMAL LOW (ref 10–232)

## 2016-05-12 MED ORDER — VITAMIN D (ERGOCALCIFEROL) 1.25 MG (50000 UNIT) PO CAPS: 50000.0000 [IU] | ORAL_CAPSULE | ORAL | 0 refills | Status: DC

## 2016-05-12 NOTE — Telephone Encounter (Signed)
Pt notified. Verbalized understanding. Rx sent to pharmacy Lab appt scheduled

## 2016-05-12 NOTE — Telephone Encounter (Signed)
-----   Message from Megan Salon, MD sent at 05/10/2016  7:10 AM EDT ----- Please let pt know her Vit D was 25.  She should take the prescription 50K weekly and recheck level in 12 weeks.  Also, her hemoglobin is low at 9.9.  Her anemia is likely due to her bleeding.  She had an endometrial biopsy last year and does not want to do anything for treatment.  She should start iron (Feosol, slow release) daily.  Recheck this in three months as well.  Orders placed.    CMP, Lipids, TSH all normal.

## 2016-05-12 NOTE — Telephone Encounter (Signed)
LM for pt to call back.

## 2016-07-18 ENCOUNTER — Other Ambulatory Visit: Payer: Self-pay | Admitting: Internal Medicine

## 2016-08-08 NOTE — Addendum Note (Signed)
Addended by: Graylon Good on: 08/08/2016 09:22 AM   Modules accepted: Orders

## 2016-08-12 ENCOUNTER — Other Ambulatory Visit (INDEPENDENT_AMBULATORY_CARE_PROVIDER_SITE_OTHER): Payer: BLUE CROSS/BLUE SHIELD

## 2016-08-12 DIAGNOSIS — D500 Iron deficiency anemia secondary to blood loss (chronic): Secondary | ICD-10-CM | POA: Diagnosis not present

## 2016-08-12 DIAGNOSIS — E559 Vitamin D deficiency, unspecified: Secondary | ICD-10-CM

## 2016-08-13 ENCOUNTER — Telehealth: Payer: Self-pay | Admitting: Obstetrics & Gynecology

## 2016-08-13 LAB — CBC
Hematocrit: 36.2 % (ref 34.0–46.6)
Hemoglobin: 11.6 g/dL (ref 11.1–15.9)
MCH: 27.8 pg (ref 26.6–33.0)
MCHC: 32 g/dL (ref 31.5–35.7)
MCV: 87 fL (ref 79–97)
PLATELETS: 296 10*3/uL (ref 150–379)
RBC: 4.17 x10E6/uL (ref 3.77–5.28)
RDW: 17.1 % — AB (ref 12.3–15.4)
WBC: 7.6 10*3/uL (ref 3.4–10.8)

## 2016-08-13 LAB — VITAMIN D 25 HYDROXY (VIT D DEFICIENCY, FRACTURES): Vit D, 25-Hydroxy: 37 ng/mL (ref 30.0–100.0)

## 2016-08-13 LAB — FERRITIN: FERRITIN: 9 ng/mL — AB (ref 15–150)

## 2016-08-13 NOTE — Telephone Encounter (Signed)
Patient called stating that she lost her cell phone and to  leave her lab results on her husbands cell. [phone]. Patient said you may leave the results with [name] if he answers. DPR on file to talk with Spouse Zachery Dauer.

## 2016-08-18 ENCOUNTER — Telehealth: Payer: Self-pay | Admitting: *Deleted

## 2016-08-18 NOTE — Telephone Encounter (Signed)
-----   Message from Megan Salon, MD sent at 08/15/2016  2:01 PM EDT ----- Please let pt know her hb was improved at 11.6.  However, her iron storage level is still low.  I think she needs to be taking oral iron or consider having an iron transfusion.  If she wants to take oral iron, she can either take [facility] and take it with some orange juice as this will help absorption OR she can take Slow FE daily.  If she has constipation, she needs to let me know.  I will repeat the ferritin level at her next exam in May.  Her Vit D was normal.  She can stop the prescription and start taking OTC VIt D 2000 IU daily.

## 2016-08-18 NOTE — Telephone Encounter (Signed)
Message left to return call to Emily at [phone].    

## 2016-08-19 NOTE — Telephone Encounter (Signed)
Patient's husband, [name], to call back. Okay to speak with per DPR and patient request (see telephone encounter dated 08/13/16).

## 2016-08-19 NOTE — Telephone Encounter (Signed)
Spoke with patient's spouse, [name], okay per DPR and patient request due to losing cell phone. Results reviewed with [name] and he verbalized understanding. Stated he would inform patient and would return call or have her return call about her preference of oral iron supplementation or iron transfusions.

## 2016-09-08 ENCOUNTER — Other Ambulatory Visit: Payer: Self-pay | Admitting: Obstetrics & Gynecology

## 2016-09-08 DIAGNOSIS — D500 Iron deficiency anemia secondary to blood loss (chronic): Secondary | ICD-10-CM

## 2016-09-08 NOTE — Telephone Encounter (Signed)
Call to patient to follow up after speaking with patient's husband. Patient states that she has just been busy and hasn't been able to return call. Patient states that she would like to proceed with iron transfusion at this time. Patient states that she has been feeling really weak lately and that taking iron pills "just doesn't bring my levels up." RN advised this message would be sent to Dr. Sabra Heck for review.   Routing to provider for review.

## 2016-09-08 NOTE — Telephone Encounter (Signed)
Order placed for hematology referral to Dr. Marin Olp for iron transfusions.  Thanks.  Ok to close encounter.

## 2016-09-09 NOTE — Telephone Encounter (Signed)
Message noted from Dr. Miller. Encounter closed. 

## 2016-10-02 ENCOUNTER — Telehealth: Payer: Self-pay | Admitting: Obstetrics & Gynecology

## 2016-10-02 NOTE — Telephone Encounter (Signed)
Reviewed with clinical nursing supervisor, schedule for OV 9/21 with Dr. Sabra Heck, review ER precautions.  Spoke with patient, patient declined OV for 10/03/16. Patient states she is still working and unable to be seen on 9/21. Patient states she prefers to wait for Monday visit at [facility]. Advised patient should bleeding start, SHOB  or palpitations develop, seek immediate care at [facility] for further evaluation. Patient verbalizes understanding and is agreeable.  Routing to provider for final review. Patient is agreeable to disposition. Will close encounter.  Cc: Lamont Snowball, RN

## 2016-10-02 NOTE — Telephone Encounter (Signed)
Left message to call Sharee Pimple at [phone].   Returned call to evaluate bleeding.

## 2016-10-02 NOTE — Telephone Encounter (Signed)
Call placed to [facility] with Dr Antonieta Pert office to check the states of referral submitted to their office on 09/09/16. Left message on his voicemail requesting a return call.   cc: Glorianne Manchester, RN

## 2016-10-02 NOTE — Telephone Encounter (Signed)
Spoke with Kathryn Gregory with Dr Antonieta Pert office, he advised the chart has been reviewed by Dr Marin Olp and is ready for scheduling. Kathryn Gregory advises he will contact patient today for scheduling.   cc: Glorianne Manchester, RN

## 2016-10-02 NOTE — Telephone Encounter (Signed)
Spoke with patient. Patient states she is still awaiting return call for scheduling iron transfusion. Reports symptoms have "increased" over the last week. Reports dizziness, weakness and constant headache without aura, "just doesn't feel good". Advised patient would f/u with hematology referral and Dr. Sabra Heck and return call with recommendations, patient is agreeable.   Spoke with Deloris Ping, to f/u with referral to hematology.

## 2016-10-02 NOTE — Telephone Encounter (Signed)
Spoke with patient. LMP 09/22/16, reports bleeding as spotting. No longer taking iron supplements. Patient aware of appointment with [facility] on 10/06/16. Aware to return call to office if bleeding becomes heavy, changing pad/tampon q1-2 hours, seek care at local ER/[facility] if after hours. Will update Dr. Sabra Heck and return call with any additional recommendations.

## 2016-10-02 NOTE — Telephone Encounter (Signed)
Per review of EPIC, patient scheduled for 10/06/16 at 3:15pm. Lab appt [facility] at 2:45pm.    Dr. Sabra Heck -please review and advise?

## 2016-10-02 NOTE — Telephone Encounter (Signed)
Patient called requesting to speak with the nurse about headaches she is having.

## 2016-10-03 ENCOUNTER — Other Ambulatory Visit: Payer: Self-pay | Admitting: *Deleted

## 2016-10-06 ENCOUNTER — Other Ambulatory Visit (HOSPITAL_BASED_OUTPATIENT_CLINIC_OR_DEPARTMENT_OTHER): Payer: BLUE CROSS/BLUE SHIELD

## 2016-10-06 ENCOUNTER — Other Ambulatory Visit: Payer: Self-pay | Admitting: Family

## 2016-10-06 ENCOUNTER — Ambulatory Visit (HOSPITAL_BASED_OUTPATIENT_CLINIC_OR_DEPARTMENT_OTHER): Payer: BLUE CROSS/BLUE SHIELD | Admitting: Family

## 2016-10-06 VITALS — BP 116/72 | HR 71 | Temp 98.4°F | Resp 17 | Wt 140.0 lb

## 2016-10-06 DIAGNOSIS — D500 Iron deficiency anemia secondary to blood loss (chronic): Secondary | ICD-10-CM

## 2016-10-06 DIAGNOSIS — D649 Anemia, unspecified: Secondary | ICD-10-CM | POA: Diagnosis not present

## 2016-10-06 LAB — COMPREHENSIVE METABOLIC PANEL (CC13)
A/G RATIO: 2.2 (ref 1.2–2.2)
ALBUMIN: 4.7 g/dL (ref 3.5–5.5)
ALT: 18 IU/L (ref 0–32)
AST: 23 IU/L (ref 0–40)
Alkaline Phosphatase, S: 78 IU/L (ref 39–117)
BUN/Creatinine Ratio: 24 — ABNORMAL HIGH (ref 9–23)
BUN: 14 mg/dL (ref 6–24)
Bilirubin Total: 0.2 mg/dL (ref 0.0–1.2)
CALCIUM: 9.1 mg/dL (ref 8.7–10.2)
CO2: 27 mmol/L (ref 20–29)
CREATININE: 0.59 mg/dL (ref 0.57–1.00)
Chloride, Ser: 104 mmol/L (ref 96–106)
GFR, EST AFRICAN AMERICAN: 123 mL/min/{1.73_m2} (ref >59)
GFR, EST NON AFRICAN AMERICAN: 106 mL/min/{1.73_m2} (ref >59)
GLOBULIN, TOTAL: 2.1 g/dL (ref 1.5–4.5)
Glucose: 92 mg/dL (ref 65–99)
POTASSIUM: 4.3 mmol/L (ref 3.5–5.2)
SODIUM: 139 mmol/L (ref 134–144)
TOTAL PROTEIN: 6.8 g/dL (ref 6.0–8.5)

## 2016-10-06 LAB — CBC WITH DIFFERENTIAL (CANCER CENTER ONLY)
BASO#: 0 10*3/uL (ref 0.0–0.2)
BASO%: 0.5 % (ref 0.0–2.0)
EOS%: 1.6 % (ref 0.0–7.0)
Eosinophils Absolute: 0.1 10*3/uL (ref 0.0–0.5)
HCT: 37.2 % (ref 34.8–46.6)
HGB: 12.3 g/dL (ref 11.6–15.9)
LYMPH#: 2.1 10*3/uL (ref 0.9–3.3)
LYMPH%: 33.8 % (ref 14.0–48.0)
MCH: 29.3 pg (ref 26.0–34.0)
MCHC: 33.1 g/dL (ref 32.0–36.0)
MCV: 89 fL (ref 81–101)
MONO#: 0.4 10*3/uL (ref 0.1–0.9)
MONO%: 6.8 % (ref 0.0–13.0)
NEUT%: 57.3 % (ref 39.6–80.0)
NEUTROS ABS: 3.6 10*3/uL (ref 1.5–6.5)
Platelets: 306 10*3/uL (ref 145–400)
RBC: 4.2 10*6/uL (ref 3.70–5.32)
RDW: 13 % (ref 11.1–15.7)
WBC: 6.2 10*3/uL (ref 3.9–10.0)

## 2016-10-06 LAB — CHCC SATELLITE - SMEAR

## 2016-10-06 NOTE — Progress Notes (Signed)
Hematology/Oncology Consultation   Name: Kathryn Gregory      MRN: [id number]    Location: Room/bed info not found  Date: 10/06/2016 Time:3:34 PM   REFERRING PHYSICIAN: Megan Salon, MD  REASON FOR CONSULT: Iron deficiency anemia due to chronic blood loss   DIAGNOSIS: Iron deficiency anemia secondary to chronic blood loss with heavy cycles  HISTORY OF PRESENT ILLNESS: Kathryn Gregory is a very pleasant 51 yo [place] female with history of iron deficiency. She states that she used to receive iron injections as a teenager. Her cycles are heavy and she has noted large clots. Her ferritin in early August was 9.  She has tried taking an oral iron supplement in Kathryn past with no improvement.  She is symptomatic with fatigue, weakness, dizziness, headache, palpitations, SOB with exertion, numbness and tingling in her hands at time and decreased appetite. She states that she has lost 10 lbs in Kathryn last month. She is hydrating well.  She had her endoscopy and colonoscopy in 2017. She had a benign gastric fibroid removed in July 2017.  She denies any episodes of bleeding (other than her cycle), no bruising or petechiae.   No family history of anemia.  No personal history of cancer. Family history includes: paternal uncle - tongue,maternal uncle - bone and maternal aunt - metastatic breast.  She has 2 children and no history of miscarriage.  No fever, chills, n/v, cough, rash, chest pain, abdominal pain or changes in bladder habits. She has had some episodes of diarrhea. No incontinence.  She has hypothyroidism and takes synthroid daily.   No swelling or tenderness in her extremities. No c/o pain at this time.  She is not a smoker and does not drink alcohol.  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   ROS: All other 10 point review of systems is negative.   PAST MEDICAL HISTORY:   Past Medical History:  Diagnosis Date  . ANEMIA-NOS   . GERD   . IBS (irritable bowel syndrome)   .  Lumbar herniated disc    not a problem now  . Other postablative hypothyroidism 07/2009   I-131ablation 07/2009 for hyperthyroid  . Rosacea     ALLERGIES: Allergies  Allergen Reactions  . Penicillins Other (See Comments)    Childhood allergy-unknown.  Has Gregory had a PCN reaction causing immediate rash, facial/tongue/throat swelling, SOB or lightheadedness with hypotension: no Has Gregory had a PCN reaction causing severe rash involving mucus membranes or skin necrosis: no Has Gregory had a PCN reaction that required hospitalization yes - she [facility] when occurred per her mother Has Gregory had a PCN reaction occurring within Kathryn last 10 years: no If all of Kathryn above answers are "NO", then may procee      MEDICATIONS:  Current Outpatient Prescriptions on File Prior to Visit  Medication Sig Dispense Refill  . levothyroxine (SYNTHROID, LEVOTHROID) 112 MCG tablet TAKE ONE TABLET BY MOUTH ONCE DAILY 90 tablet 1  . Vitamin D, Ergocalciferol, (DRISDOL) 50000 units CAPS capsule Take 1 capsule (50,000 Units total) by mouth every 7 (seven) days. 12 capsule 0   No current facility-administered medications on file prior to visit.      PAST SURGICAL HISTORY Past Surgical History:  Procedure Laterality Date  . EUS N/A 05/04/2015   Procedure: UPPER ENDOSCOPIC ULTRASOUND (EUS) LINEAR;  Surgeon: Carol Ada, MD;  Location: [facility];  Service: Endoscopy;  Laterality: N/A;  . EUS N/A 06/15/2015  Procedure: UPPER ENDOSCOPIC ULTRASOUND (EUS) LINEAR;  Surgeon: Carol Ada, MD;  Location: [facility];  Service: Endoscopy;  Laterality: N/A;  . LAPAROSCOPIC GASTRIC RESECTION N/A 08/03/2015   Procedure: LAPAROSCOPIC GASTROTOMY WITH EXCISION OF  GASTIC TUMOR;  Surgeon: Jackolyn Confer, MD;  Location: [facility];  Service: General;  Laterality: N/A;  . TONSILLECTOMY    . TUBAL LIGATION  1998    FAMILY HISTORY: Family History  Problem Relation Age of Onset  . Mental illness Mother   .  Lung disease Mother        lung fibrosis  . Breast cancer Maternal Aunt   . Diabetes Father 57  . Colon cancer Paternal Uncle   . Dementia Maternal Grandmother   . Breast cancer Cousin 27  . Cancer Other        cancer of tongue  . Multiple births Sister        3 sets of twins  . Cancer Maternal Uncle        unsure type  . Diabetes Paternal Grandmother     SOCIAL HISTORY:  reports that she has quit smoking. She quit after 10.00 years of use. She has never used smokeless tobacco. She reports that she drinks about 0.6 oz of alcohol per week . She reports that she does not use drugs.  PERFORMANCE STATUS: Kathryn Gregory's performance status is 1 - Symptomatic but completely ambulatory  PHYSICAL EXAM: Most Recent Vital Signs: Blood pressure 116/72, pulse 71, temperature 98.4 F (36.9 C), temperature source Oral, resp. rate 17, weight 140 lb (63.5 kg), SpO2 100 %. BP 116/72 (BP Location: Left Arm, Gregory Position: Sitting)   Pulse 71   Temp 98.4 F (36.9 C) (Oral)   Resp 17   Wt 140 lb (63.5 kg)   SpO2 100%   BMI 22.60 kg/m   General Appearance:    Alert, cooperative, no distress, appears stated age  Head:    Normocephalic, without obvious abnormality, atraumatic  Eyes:    PERRL, conjunctiva/corneas clear, EOM's intact, fundi    benign, both eyes  Ears:    Normal TM's and external ear canals, both ears  Nose:   Nares normal, septum midline, mucosa normal, no drainage    or sinus tenderness  Throat:   Lips, mucosa, and tongue normal; teeth and gums normal  Neck:   Supple, symmetrical, trachea midline, no adenopathy;    thyroid:  no enlargement/tenderness/nodules; no carotid   bruit or JVD  Back:     Symmetric, no curvature, ROM normal, no CVA tenderness  Lungs:     Clear to auscultation bilaterally, respirations unlabored  Chest Wall:    No tenderness or deformity   Heart:    Regular rate and rhythm, S1 and S2 normal, no murmur, rub   or gallop  Breast Exam:    No tenderness,  masses, or nipple abnormality  Abdomen:     Soft, non-tender, bowel sounds active all four quadrants,    no masses, no organomegaly  Genitalia:    Normal female without lesion, discharge or tenderness  Rectal:    Normal tone, normal prostate, no masses or tenderness;   guaiac negative stool  Extremities:   Extremities normal, atraumatic, no cyanosis or edema  Pulses:   2+ and symmetric all extremities  Skin:   Skin color, texture, turgor normal, no rashes or lesions  Lymph nodes:   Cervical, supraclavicular, and axillary nodes normal  Neurologic:   CNII-XII intact, normal strength, sensation and reflexes  throughout    LABORATORY DATA:  Results for orders placed or performed in visit on 10/06/16 (from Kathryn past 48 hour(s))  CBC w/Diff     Status: None   Collection Time: 10/06/16  3:02 PM  Result Value Ref Range   WBC 6.2 3.9 - 10.0 10e3/uL   RBC 4.20 3.70 - 5.32 10e6/uL   HGB 12.3 11.6 - 15.9 g/dL   HCT 37.2 34.8 - 46.6 %   MCV 89 81 - 101 fL   MCH 29.3 26.0 - 34.0 pg   MCHC 33.1 32.0 - 36.0 g/dL   RDW 13.0 11.1 - 15.7 %   Platelets 306 145 - 400 10e3/uL   NEUT# 3.6 1.5 - 6.5 10e3/uL   LYMPH# 2.1 0.9 - 3.3 10e3/uL   MONO# 0.4 0.1 - 0.9 10e3/uL   Eosinophils Absolute 0.1 0.0 - 0.5 10e3/uL   BASO# 0.0 0.0 - 0.2 10e3/uL   NEUT% 57.3 39.6 - 80.0 %   LYMPH% 33.8 14.0 - 48.0 %   MONO% 6.8 0.0 - 13.0 %   EOS% 1.6 0.0 - 7.0 %   BASO% 0.5 0.0 - 2.0 %  Smear     Status: None   Collection Time: 10/06/16  3:02 PM  Result Value Ref Range   Smear Result Smear Available       RADIOGRAPHY: No results found.     PATHOLOGY: None  ASSESSMENT/PLAN: Kathryn Gregory is a very pleasant 51 yo [place] female with history of iron deficiency secondary to heavy cycles. She has received iron injections in Kathryn past as a teenager. She had no improvement on oral iron. She is quite symptomatic as mentioned above.  We will see what her iron studies show and bring her back in later this week for an  infusion if needed.  We will plan to see her back again in another 6 weeks for repeat lab work and follow-up.    All questions were answered and she is in agreement with Kathryn plan. She will contact our office with any questions or concerns. We can certainly see her sooner if necessary.  She was discussed with and also seen by Dr. Marin Olp and he is in agreement with Kathryn aforementioned.   [facility]   Addendum:  I saw and examined Kathryn Gregory with Kathryn Gregory. This was a shared visit. I agree with her above assessment.  She clearly has iron deficiency. Her ferritin is only 8 with an iron saturation of 12%.  She still has heavy cycles. Hopefully, these will stop soon as she is 51 years old. I would have to think that she is close to being menopausal.  By her peripheral blood smear, she has hypochromic and microcytic red blood cells. There was some anisocytosis and poikilocytosis. I saw no nucleated red blood cells.  I believe IV iron will help her out quite a bit. We will set this up.  We spent about 40 minutes with her.  We will plan to get her back in another 6 months.  We answered her questions. We reviewed her lab work with her.  Lattie Haw, MD

## 2016-10-07 DIAGNOSIS — D500 Iron deficiency anemia secondary to blood loss (chronic): Secondary | ICD-10-CM | POA: Insufficient documentation

## 2016-10-07 LAB — IRON AND TIBC
%SAT: 12 % — AB (ref 21–57)
Iron: 51 ug/dL (ref 41–142)
TIBC: 429 ug/dL (ref 236–444)
UIBC: 378 ug/dL (ref 120–384)

## 2016-10-07 LAB — HEMOGLOBINOPATHY EVALUATION
HEMOGLOBIN F QUANTITATION: 0 % (ref 0.0–2.0)
HGB A: 97.6 % (ref 96.4–98.8)
HGB C: 0 %
HGB S: 0 %
HGB VARIANT: 0 %
Hemoglobin A2 Quantitation: 2.4 % (ref 1.8–3.2)

## 2016-10-07 LAB — RETICULOCYTES: RETICULOCYTE COUNT: 1 % (ref 0.6–2.6)

## 2016-10-07 LAB — FERRITIN: Ferritin: 8 ng/ml — ABNORMAL LOW (ref 9–269)

## 2016-10-20 ENCOUNTER — Ambulatory Visit (HOSPITAL_BASED_OUTPATIENT_CLINIC_OR_DEPARTMENT_OTHER): Payer: BLUE CROSS/BLUE SHIELD

## 2016-10-20 VITALS — BP 101/58 | HR 71 | Temp 98.0°F | Resp 17

## 2016-10-20 DIAGNOSIS — D500 Iron deficiency anemia secondary to blood loss (chronic): Secondary | ICD-10-CM

## 2016-10-20 MED ORDER — SODIUM CHLORIDE 0.9 % IV SOLN
Freq: Once | INTRAVENOUS | Status: AC
  Administered 2016-10-20: 15:00:00 via INTRAVENOUS

## 2016-10-20 MED ORDER — SODIUM CHLORIDE 0.9 % IV SOLN
510.0000 mg | Freq: Once | INTRAVENOUS | Status: AC
  Administered 2016-10-20: 510 mg via INTRAVENOUS
  Filled 2016-10-20: qty 17

## 2016-10-20 NOTE — Patient Instructions (Signed)

## 2016-10-29 ENCOUNTER — Ambulatory Visit (HOSPITAL_BASED_OUTPATIENT_CLINIC_OR_DEPARTMENT_OTHER): Payer: BLUE CROSS/BLUE SHIELD

## 2016-10-29 VITALS — BP 98/62 | HR 77 | Temp 98.0°F | Resp 16

## 2016-10-29 DIAGNOSIS — D500 Iron deficiency anemia secondary to blood loss (chronic): Secondary | ICD-10-CM

## 2016-10-29 MED ORDER — SODIUM CHLORIDE 0.9 % IV SOLN
510.0000 mg | Freq: Once | INTRAVENOUS | Status: AC
  Administered 2016-10-29: 510 mg via INTRAVENOUS
  Filled 2016-10-29: qty 17

## 2016-10-29 MED ORDER — SODIUM CHLORIDE 0.9 % IV SOLN
Freq: Once | INTRAVENOUS | Status: AC
  Administered 2016-10-29: 15:00:00 via INTRAVENOUS

## 2016-10-29 NOTE — Patient Instructions (Signed)

## 2016-11-17 ENCOUNTER — Ambulatory Visit (HOSPITAL_BASED_OUTPATIENT_CLINIC_OR_DEPARTMENT_OTHER): Payer: BLUE CROSS/BLUE SHIELD | Admitting: Family

## 2016-11-17 ENCOUNTER — Other Ambulatory Visit (HOSPITAL_BASED_OUTPATIENT_CLINIC_OR_DEPARTMENT_OTHER): Payer: BLUE CROSS/BLUE SHIELD

## 2016-11-17 VITALS — BP 105/60 | HR 66 | Temp 98.3°F | Resp 18 | Wt 143.5 lb

## 2016-11-17 DIAGNOSIS — D500 Iron deficiency anemia secondary to blood loss (chronic): Secondary | ICD-10-CM

## 2016-11-17 LAB — IRON AND TIBC
%SAT: 45 % (ref 21–57)
Iron: 116 ug/dL (ref 41–142)
TIBC: 258 ug/dL (ref 236–444)
UIBC: 142 ug/dL (ref 120–384)

## 2016-11-17 LAB — CBC WITH DIFFERENTIAL (CANCER CENTER ONLY)
BASO#: 0 10*3/uL (ref 0.0–0.2)
BASO%: 0.5 % (ref 0.0–2.0)
EOS%: 1.3 % (ref 0.0–7.0)
Eosinophils Absolute: 0.1 10*3/uL (ref 0.0–0.5)
HEMATOCRIT: 37.5 % (ref 34.8–46.6)
HEMOGLOBIN: 12.5 g/dL (ref 11.6–15.9)
LYMPH#: 1.6 10*3/uL (ref 0.9–3.3)
LYMPH%: 26.1 % (ref 14.0–48.0)
MCH: 30.6 pg (ref 26.0–34.0)
MCHC: 33.3 g/dL (ref 32.0–36.0)
MCV: 92 fL (ref 81–101)
MONO#: 0.5 10*3/uL (ref 0.1–0.9)
MONO%: 7.8 % (ref 0.0–13.0)
NEUT%: 64.3 % (ref 39.6–80.0)
NEUTROS ABS: 3.9 10*3/uL (ref 1.5–6.5)
Platelets: 271 10*3/uL (ref 145–400)
RBC: 4.09 10*6/uL (ref 3.70–5.32)
RDW: 14.9 % (ref 11.1–15.7)
WBC: 6.1 10*3/uL (ref 3.9–10.0)

## 2016-11-17 LAB — FERRITIN: FERRITIN: 292 ng/mL — AB (ref 9–269)

## 2016-11-17 NOTE — Progress Notes (Signed)
Hematology and Oncology Follow Up Visit  Kathryn Gregory [id number] 1965/03/27 51 y.o. 11/17/2016   Principle Diagnosis:  Iron deficiency anemia secondary to chronic blood loss with heavy cycles  Current Therapy:   IV iron as indicated - last received in October 2018 x 2   Interim History:  Kathryn Gregory is here today for follow-up. She is doing well and feeling much better. She still has a "swishing" sound in her ears that comes and goes.  Her other symptoms have improved or resolved.  No episodes of bleeding, bruising or petechiae. No lymphadenopathy found on exam.  No fever, chills, n/v, cough, rash, dizziness, SOB, chest pain, palpitations, abdominal pain or changes in bowel or bladder habits.  No swelling, tenderness, numbness or tingling in her extremities. No c/o pain.  She has maintained a good appetite and is staying well hydrated. Her weight is stable.   ECOG Performance Status: 1 - Symptomatic but completely ambulatory  Medications:  Allergies as of 11/17/2016      Reactions   Penicillins Other (See Comments)   Childhood allergy-unknown.  Has patient had a PCN reaction causing immediate rash, facial/tongue/throat swelling, SOB or lightheadedness with hypotension: no Has patient had a PCN reaction causing severe rash involving mucus membranes or skin necrosis: no Has patient had a PCN reaction that required hospitalization yes - she was in the hospital when occurred per her mother Has patient had a PCN reaction occurring within the last 10 years: no If all of the above answers are "NO", then may procee      Medication List        Accurate as of 11/17/16  9:29 AM. Always use your most recent med list.          levothyroxine 112 MCG tablet Commonly known as:  SYNTHROID, LEVOTHROID TAKE ONE TABLET BY MOUTH ONCE DAILY       Allergies:  Allergies  Allergen Reactions  . Penicillins Other (See Comments)    Childhood allergy-unknown.  Has patient had a PCN reaction  causing immediate rash, facial/tongue/throat swelling, SOB or lightheadedness with hypotension: no Has patient had a PCN reaction causing severe rash involving mucus membranes or skin necrosis: no Has patient had a PCN reaction that required hospitalization yes - she was in the hospital when occurred per her mother Has patient had a PCN reaction occurring within the last 10 years: no If all of the above answers are "NO", then may procee    Past Medical History, Surgical history, Social history, and Family History were reviewed and updated.  Review of Systems: All other 10 point review of systems is negative.   Physical Exam:  weight is 143 lb 8 oz (65.1 kg). Her oral temperature is 98.3 F (36.8 C). Her blood pressure is 105/60 and her pulse is 66. Her respiration is 18 and oxygen saturation is 100%.   Wt Readings from Last 3 Encounters:  11/17/16 143 lb 8 oz (65.1 kg)  10/06/16 140 lb (63.5 kg)  05/08/16 151 lb (68.5 kg)    Ocular: Sclerae unicteric, pupils equal, round and reactive to light Ear-nose-throat: Oropharynx clear, dentition fair Lymphatic: No cervical, supraclavicular or axillary adenopathy Lungs no rales or rhonchi, good excursion bilaterally Heart regular rate and rhythm, no murmur appreciated Abd soft, nontender, positive bowel sounds, no liver or spleen tip palpated on exam, no fluid wave  MSK no focal spinal tenderness, no joint edema Neuro: non-focal, well-oriented, appropriate affect Breasts: Deferred   Lab Results  Component  Value Date   WBC 6.2 10/06/2016   HGB 12.3 10/06/2016   HCT 37.2 10/06/2016   MCV 89 10/06/2016   PLT 306 10/06/2016   Lab Results  Component Value Date   FERRITIN 8 (L) 10/06/2016   IRON 51 10/06/2016   TIBC 429 10/06/2016   UIBC 378 10/06/2016   IRONPCTSAT 12 (L) 10/06/2016   Lab Results  Component Value Date   RBC 4.20 10/06/2016   No results found for: KPAFRELGTCHN, LAMBDASER, KAPLAMBRATIO No results found for:  IGGSERUM, IGA, IGMSERUM No results found for: Odetta Pink, SPEI   Chemistry      Component Value Date/Time   NA 139 10/06/2016 1502   K 4.3 10/06/2016 1502   CL 104 10/06/2016 1502   CO2 27 10/06/2016 1502   BUN 14 10/06/2016 1502   CREATININE 0.59 10/06/2016 1502   CREATININE 0.64 05/08/2016 1434      Component Value Date/Time   CALCIUM 9.1 10/06/2016 1502   ALKPHOS 78 10/06/2016 1502   AST 23 10/06/2016 1502   ALT 18 10/06/2016 1502   BILITOT 0.2 10/06/2016 1502      Impression and Plan: Kathryn Gregory is a very pleasant 51 yo [place] female with iron deficiency anemia secondary to chronic blood loss with heavy cycles. She received 2 doses of IV iron in October and has responded nicely. Her symptoms have resolved for the most part. She still has the occasional "swishing" sound in her ears.  We will see what her iron studies show and bring her back in later this week for an infusion if needed.  We will plan to see her back again in another 2 months for repeat lab work and follow-up. She will contact our office with any questions or concerns. We can certainly see her sooner if need be.   Eliezer Bottom, NP 11/5/20189:29 AM

## 2017-01-15 DIAGNOSIS — S61210A Laceration without foreign body of right index finger without damage to nail, initial encounter: Secondary | ICD-10-CM | POA: Diagnosis not present

## 2017-01-19 ENCOUNTER — Other Ambulatory Visit: Payer: Self-pay

## 2017-01-19 ENCOUNTER — Inpatient Hospital Stay: Payer: BLUE CROSS/BLUE SHIELD | Attending: Family | Admitting: Family

## 2017-01-19 ENCOUNTER — Encounter: Payer: Self-pay | Admitting: Family

## 2017-01-19 ENCOUNTER — Inpatient Hospital Stay: Payer: BLUE CROSS/BLUE SHIELD

## 2017-01-19 VITALS — BP 102/60 | HR 64 | Temp 98.3°F | Resp 16 | Wt 146.0 lb

## 2017-01-19 DIAGNOSIS — D500 Iron deficiency anemia secondary to blood loss (chronic): Secondary | ICD-10-CM

## 2017-01-19 DIAGNOSIS — N920 Excessive and frequent menstruation with regular cycle: Secondary | ICD-10-CM | POA: Diagnosis not present

## 2017-01-19 LAB — CBC WITH DIFFERENTIAL (CANCER CENTER ONLY)
Abs Granulocyte: 5 10*3/uL (ref 1.5–6.5)
BASOS ABS: 0 10*3/uL (ref 0.0–0.1)
Basophils Relative: 0 %
EOS ABS: 0.1 10*3/uL (ref 0.0–0.5)
EOS PCT: 1 %
HCT: 37.8 % (ref 34.8–46.6)
Hemoglobin: 12.7 g/dL (ref 11.6–15.9)
LYMPHS PCT: 22 %
Lymphs Abs: 1.6 10*3/uL (ref 0.9–3.3)
MCH: 32.1 pg (ref 26.0–34.0)
MCHC: 33.6 g/dL (ref 32.0–36.0)
MCV: 95.5 fL (ref 81.0–101.0)
MONO ABS: 0.5 10*3/uL (ref 0.1–0.9)
Monocytes Relative: 6 %
NEUTROS PCT: 71 %
Neutro Abs: 5 10*3/uL (ref 1.5–6.5)
PLATELETS: 278 10*3/uL (ref 145–400)
RBC: 3.96 MIL/uL (ref 3.70–5.32)
RDW: 12.9 % (ref 11.1–15.7)
WBC: 7.1 10*3/uL (ref 4.0–10.3)

## 2017-01-19 LAB — IRON AND TIBC
Iron: 95 ug/dL (ref 41–142)
SATURATION RATIOS: 34 % (ref 21–57)
TIBC: 276 ug/dL (ref 236–444)
UIBC: 182 ug/dL

## 2017-01-19 LAB — FERRITIN: Ferritin: 83 ng/mL (ref 9–269)

## 2017-01-19 NOTE — Progress Notes (Signed)
Hematology and Oncology Follow Up Visit  SELENI MELLER [id number] April 08, 1965 52 y.o. 01/19/2017   Principle Diagnosis:  Iron deficiency anemia secondary to chronic blood loss with heavy cycles  Current Therapy:   IV iron as indicated - last received in October 2018 x 2   Interim History:  Ms. Fouch is here today for follow-up. She is doing quite well and feels much better since receiving IV iron in October. Her symptoms have almost completely resolved.  The "humming" in her ears has improved.  No fever, chills, n/v, cough, rash, dizziness, SOB, chest pain, palpitations, abdominal pain or changes in bowel or bladder habits.  Her cycle was only heavy one day last month. No other bleeding, no bruising or petechiae.  No lymphadenopathy found on exam.  No swelling, tenderness, numbness or tingling in her extremities. No c/o pain.  She has maintained a good appetite and is staying well hydrated. Her weight is stable.   ECOG Performance Status: 1 - Symptomatic but completely ambulatory  Medications:  Allergies as of 01/19/2017      Reactions   Penicillins Other (See Comments)   Childhood allergy-unknown.  Has patient had a PCN reaction causing immediate rash, facial/tongue/throat swelling, SOB or lightheadedness with hypotension: no Has patient had a PCN reaction causing severe rash involving mucus membranes or skin necrosis: no Has patient had a PCN reaction that required hospitalization yes - she was in the hospital when occurred per her mother Has patient had a PCN reaction occurring within the last 10 years: no If all of the above answers are "NO", then may procee      Medication List        Accurate as of 01/19/17 10:48 AM. Always use your most recent med list.          levothyroxine 112 MCG tablet Commonly known as:  SYNTHROID, LEVOTHROID TAKE ONE TABLET BY MOUTH ONCE DAILY       Allergies:  Allergies  Allergen Reactions  . Penicillins Other (See Comments)   Childhood allergy-unknown.  Has patient had a PCN reaction causing immediate rash, facial/tongue/throat swelling, SOB or lightheadedness with hypotension: no Has patient had a PCN reaction causing severe rash involving mucus membranes or skin necrosis: no Has patient had a PCN reaction that required hospitalization yes - she was in the hospital when occurred per her mother Has patient had a PCN reaction occurring within the last 10 years: no If all of the above answers are "NO", then may procee    Past Medical History, Surgical history, Social history, and Family History were reviewed and updated.  Review of Systems: All other 10 point review of systems is negative.   Physical Exam:  weight is 146 lb (66.2 kg). Her oral temperature is 98.3 F (36.8 C). Her blood pressure is 102/60 and her pulse is 64. Her respiration is 16 and oxygen saturation is 100%.   Wt Readings from Last 3 Encounters:  01/19/17 146 lb (66.2 kg)  11/17/16 143 lb 8 oz (65.1 kg)  10/06/16 140 lb (63.5 kg)    Ocular: Sclerae unicteric, pupils equal, round and reactive to light Ear-nose-throat: Oropharynx clear, dentition fair Lymphatic: No cervical, supraclavicular or axillary adenopathy Lungs no rales or rhonchi, good excursion bilaterally Heart regular rate and rhythm, no murmur appreciated Abd soft, nontender, positive bowel sounds, no liver or spleen tip palpated on exam, no fluid wave  MSK no focal spinal tenderness, no joint edema Neuro: non-focal, well-oriented, appropriate affect Breasts: Deferred  Lab Results  Component Value Date   WBC 6.1 11/17/2016   HGB 12.5 11/17/2016   HCT 37.8 01/19/2017   MCV 95.5 01/19/2017   PLT 271 11/17/2016   Lab Results  Component Value Date   FERRITIN 292 (H) 11/17/2016   IRON 116 11/17/2016   TIBC 258 11/17/2016   UIBC 142 11/17/2016   IRONPCTSAT 45 11/17/2016   Lab Results  Component Value Date   RBC 3.96 01/19/2017   No results found for:  KPAFRELGTCHN, LAMBDASER, KAPLAMBRATIO No results found for: IGGSERUM, IGA, IGMSERUM No results found for: Odetta Pink, SPEI   Chemistry      Component Value Date/Time   NA 139 10/06/2016 1502   K 4.3 10/06/2016 1502   CL 104 10/06/2016 1502   CO2 27 10/06/2016 1502   BUN 14 10/06/2016 1502   CREATININE 0.59 10/06/2016 1502   CREATININE 0.64 05/08/2016 1434      Component Value Date/Time   CALCIUM 9.1 10/06/2016 1502   ALKPHOS 78 10/06/2016 1502   AST 23 10/06/2016 1502   ALT 18 10/06/2016 1502   BILITOT 0.2 10/06/2016 1502      Impression and Plan: Ms. Barrientes is a very pleasant 52 yo [place] female with iron deficiency anemia secondary to chronic blood loss with heavy cycles. She has responded nicely to the 2 doses of IV iron she received in October and her symptoms continue to improve.  We will see what her iron studies show and bring her back in later this week for an infusion if needed. We will plan to see her back in another 3 months for follow-up.  She will contact our office with any questions or concerns. We can certainly see her sooner if need be.   Laverna Peace, NP 1/7/201910:48 AM

## 2017-03-29 ENCOUNTER — Other Ambulatory Visit: Payer: Self-pay | Admitting: Internal Medicine

## 2017-04-04 ENCOUNTER — Other Ambulatory Visit: Payer: Self-pay | Admitting: Internal Medicine

## 2017-04-06 NOTE — Telephone Encounter (Signed)
i'm not showing 2 consistant normal TSH labs, and patient's last wellness visit with dr Sharlet Salina was 04/2014---are you ok with refilling or do you need office visit?  Please advise, I will let patient know, thanks

## 2017-04-06 NOTE — Telephone Encounter (Signed)
Needs physical, okay to send in 30 days

## 2017-04-07 ENCOUNTER — Other Ambulatory Visit: Payer: Self-pay

## 2017-04-07 MED ORDER — LEVOTHYROXINE SODIUM 112 MCG PO TABS: 112.0000 ug | ORAL_TABLET | Freq: Every day | ORAL | 0 refills | Status: DC

## 2017-04-20 ENCOUNTER — Encounter: Payer: Self-pay | Admitting: Family

## 2017-04-20 ENCOUNTER — Inpatient Hospital Stay: Payer: BLUE CROSS/BLUE SHIELD | Attending: Family | Admitting: Family

## 2017-04-20 ENCOUNTER — Inpatient Hospital Stay: Payer: BLUE CROSS/BLUE SHIELD

## 2017-04-20 ENCOUNTER — Other Ambulatory Visit: Payer: Self-pay

## 2017-04-20 VITALS — BP 104/53 | HR 56 | Temp 98.1°F | Resp 16 | Wt 154.0 lb

## 2017-04-20 DIAGNOSIS — D500 Iron deficiency anemia secondary to blood loss (chronic): Secondary | ICD-10-CM

## 2017-04-20 LAB — CBC WITH DIFFERENTIAL (CANCER CENTER ONLY)
BASOS ABS: 0 10*3/uL (ref 0.0–0.1)
Basophils Relative: 0 %
Eosinophils Absolute: 0.2 10*3/uL (ref 0.0–0.5)
Eosinophils Relative: 4 %
HEMATOCRIT: 38.8 % (ref 34.8–46.6)
HEMOGLOBIN: 13.1 g/dL (ref 11.6–15.9)
LYMPHS PCT: 29 %
Lymphs Abs: 1.7 10*3/uL (ref 0.9–3.3)
MCH: 32.4 pg (ref 26.0–34.0)
MCHC: 33.8 g/dL (ref 32.0–36.0)
MCV: 96 fL (ref 81.0–101.0)
MONO ABS: 0.4 10*3/uL (ref 0.1–0.9)
MONOS PCT: 8 %
NEUTROS ABS: 3.4 10*3/uL (ref 1.5–6.5)
NEUTROS PCT: 59 %
Platelet Count: 262 10*3/uL (ref 145–400)
RBC: 4.04 MIL/uL (ref 3.70–5.32)
RDW: 12.9 % (ref 11.1–15.7)
WBC Count: 5.7 10*3/uL (ref 3.9–10.0)

## 2017-04-20 LAB — IRON AND TIBC
Iron: 97 ug/dL (ref 41–142)
Saturation Ratios: 33 % (ref 21–57)
TIBC: 298 ug/dL (ref 236–444)
UIBC: 201 ug/dL

## 2017-04-20 LAB — FERRITIN: Ferritin: 65 ng/mL (ref 9–269)

## 2017-04-20 NOTE — Progress Notes (Signed)
Hematology and Oncology Follow Up Visit  Kathryn Gregory [id number] 12-Nov-1965 52 y.o. 04/20/2017   Principle Diagnosis:  Iron deficiency anemia secondary to chronic blood loss with heavy cycles  Current Therapy:   IV iron as indicated - last received in October 2018 x 2   Interim History: Kathryn Gregory is here today for follow-up. She got a little dizzy after her lab draw but is feeling much better now after sitting for a few minutes.  She has no complaints at this time and overall has been quite well.  She is still having a cycle. She states that it is light and lasts up to 8 days.  She has an occasional "humming" noise in her ears. This comes and goes.  No other bleeding, no bruising or petechiae. No lymphadenopathy noted on exam.  No fever, chills, n/v, cough, rash, dizziness, SOB, chest pain, palpitations, abdominal pain or changes in bowel or bladder habits.  No swelling, tenderness, numbness or tingling in her extremities. No c/o pain.  She has maintained a good appetite and is staying well hydrated. Her weight is stable.   ECOG Performance Status: 1 - Symptomatic but completely ambulatory  Medications:  Allergies as of 04/20/2017      Reactions   Penicillins Other (See Comments)   Childhood allergy-unknown.  Has patient had a PCN reaction causing immediate rash, facial/tongue/throat swelling, SOB or lightheadedness with hypotension: no Has patient had a PCN reaction causing severe rash involving mucus membranes or skin necrosis: no Has patient had a PCN reaction that required hospitalization yes - she was in the hospital when occurred per her mother Has patient had a PCN reaction occurring within the last 10 years: no If all of the above answers are "NO", then may procee      Medication List        Accurate as of 04/20/17  9:36 AM. Always use your most recent med list.          levothyroxine 112 MCG tablet Commonly known as:  SYNTHROID, LEVOTHROID Take 1 tablet (112 mcg  total) by mouth daily. Need annual appointment for further refills   levothyroxine 112 MCG tablet Commonly known as:  SYNTHROID, LEVOTHROID Take 1 tablet (112 mcg total) by mouth daily.       Allergies:  Allergies  Allergen Reactions  . Penicillins Other (See Comments)    Childhood allergy-unknown.  Has patient had a PCN reaction causing immediate rash, facial/tongue/throat swelling, SOB or lightheadedness with hypotension: no Has patient had a PCN reaction causing severe rash involving mucus membranes or skin necrosis: no Has patient had a PCN reaction that required hospitalization yes - she was in the hospital when occurred per her mother Has patient had a PCN reaction occurring within the last 10 years: no If all of the above answers are "NO", then may procee    Past Medical History, Surgical history, Social history, and Family History were reviewed and updated.  Review of Systems: All other 10 point review of systems is negative.   Physical Exam:  weight is 154 lb (69.9 kg). Her oral temperature is 98.1 F (36.7 C). Her blood pressure is 104/53 (abnormal) and her pulse is 56 (abnormal). Her respiration is 16 and oxygen saturation is 100%.   Wt Readings from Last 3 Encounters:  04/20/17 154 lb (69.9 kg)  01/19/17 146 lb (66.2 kg)  11/17/16 143 lb 8 oz (65.1 kg)    Ocular: Sclerae unicteric, pupils equal, round and reactive to light  Ear-nose-throat: Oropharynx clear, dentition fair Lymphatic: No cervical, supraclavicular or axillary adenopathy Lungs no rales or rhonchi, good excursion bilaterally Heart regular rate and rhythm, no murmur appreciated Abd soft, nontender, positive bowel sounds, no liver or spleen tip palpated on exam, no fluid wave  MSK no focal spinal tenderness, no joint edema Neuro: non-focal, well-oriented, appropriate affect Breasts: Deferred   Lab Results  Component Value Date   WBC 5.7 04/20/2017   HGB 12.5 11/17/2016   HCT 38.8 04/20/2017    MCV 96.0 04/20/2017   PLT 262 04/20/2017   Lab Results  Component Value Date   FERRITIN 83 01/19/2017   IRON 95 01/19/2017   TIBC 276 01/19/2017   UIBC 182 01/19/2017   IRONPCTSAT 34 01/19/2017   Lab Results  Component Value Date   RBC 4.04 04/20/2017   No results found for: KPAFRELGTCHN, LAMBDASER, KAPLAMBRATIO No results found for: IGGSERUM, IGA, IGMSERUM No results found for: Odetta Pink, SPEI   Chemistry      Component Value Date/Time   NA 139 10/06/2016 1502   K 4.3 10/06/2016 1502   CL 104 10/06/2016 1502   CO2 27 10/06/2016 1502   BUN 14 10/06/2016 1502   CREATININE 0.59 10/06/2016 1502   CREATININE 0.64 05/08/2016 1434      Component Value Date/Time   CALCIUM 9.1 10/06/2016 1502   ALKPHOS 78 10/06/2016 1502   AST 23 10/06/2016 1502   ALT 18 10/06/2016 1502   BILITOT 0.2 10/06/2016 1502      Impression and Plan: Kathryn Gregory is a very pleasant 52 yo [place] female with iron deficiency anemia secondary to chronic blood loss with cycle. She is doing well and has no complaints at this time.  We will see what her iron studies show and bring her back in for infusion if needed.  We will plan to see her back in another 4 months for follow-up.  She will contact our office with any questions or concerns. We can certainly see her sooner if need be.   Laverna Peace, NP 4/8/20199:36 AM

## 2017-05-25 ENCOUNTER — Other Ambulatory Visit (HOSPITAL_COMMUNITY)
Admission: RE | Admit: 2017-05-25 | Discharge: 2017-05-25 | Disposition: A | Discharge status: Home / Self Care | Payer: BLUE CROSS/BLUE SHIELD | Source: Ambulatory Visit | Attending: Obstetrics & Gynecology | Admitting: Obstetrics & Gynecology

## 2017-05-25 ENCOUNTER — Other Ambulatory Visit: Payer: Self-pay

## 2017-05-25 ENCOUNTER — Ambulatory Visit (INDEPENDENT_AMBULATORY_CARE_PROVIDER_SITE_OTHER): Payer: BLUE CROSS/BLUE SHIELD | Admitting: Obstetrics & Gynecology

## 2017-05-25 ENCOUNTER — Encounter: Payer: Self-pay | Admitting: Obstetrics & Gynecology

## 2017-05-25 VITALS — BP 100/66 | HR 76 | Resp 14 | Ht 66.0 in | Wt 155.0 lb

## 2017-05-25 DIAGNOSIS — Z124 Encounter for screening for malignant neoplasm of cervix: Secondary | ICD-10-CM

## 2017-05-25 DIAGNOSIS — Z862 Personal history of diseases of the blood and blood-forming organs and certain disorders involving the immune mechanism: Secondary | ICD-10-CM | POA: Diagnosis not present

## 2017-05-25 DIAGNOSIS — M85679 Other cyst of bone, unspecified ankle and foot: Secondary | ICD-10-CM | POA: Diagnosis not present

## 2017-05-25 DIAGNOSIS — E039 Hypothyroidism, unspecified: Secondary | ICD-10-CM | POA: Diagnosis not present

## 2017-05-25 DIAGNOSIS — Z01419 Encounter for gynecological examination (general) (routine) without abnormal findings: Secondary | ICD-10-CM | POA: Diagnosis not present

## 2017-05-25 MED ORDER — LEVOTHYROXINE SODIUM 112 MCG PO TABS: 112.0000 ug | ORAL_TABLET | Freq: Every day | ORAL | 4 refills | Status: DC

## 2017-05-25 NOTE — Progress Notes (Signed)
52 y.o. G1W2993 MarriedHispanicF here for annual exam.  Has had iron infusions with Dr. Marin Olp.  Reports she had a "buzzing sensation" in the back of her head and this has resolved.    Cycles are changing this year.  April's cycle was long and really heavy.  This cycle lasted 7 days.  The first two days, she had large clots.  Has not had a cycle in May.  She skipped her cycle in March.  She thinks she's skipped at least 3 cycles during that time.    Endometrial biopsy last year was negative for abnormal cells.    Has noted tender area on foot.  Feels like there is something causing tenderness/pressure.  Denies trauma.  Does not wear heels.  Would like suggestion about what to do.  Patient's last menstrual period was 04/16/2017 (approximate).          Sexually active: Yes.    The current method of family planning is tubal ligation.    Exercising: Yes.    walk Smoker:  no  Health Maintenance: Pap:  01/25/15 Neg. HR HPV:neg  History of abnormal Pap:  no MMG:  02/19/15 [place] left and BX Neg  Colonoscopy:  02/23/15 Polyps. F/u 5 years  BMD:   Never TDaP:  2006.  Aware this is due.  Does not want it today.  Knows to get updated with any exposure.  Pneumonia vaccine(s):  n/a Shingrix:   No Hep C testing: n/a Screening Labs: here today   reports that she has quit smoking. She quit after 10.00 years of use. She has never used smokeless tobacco. She reports that she drinks about 0.6 oz of alcohol per week. She reports that she does not use drugs.  Past Medical History:  Diagnosis Date  . ANEMIA-NOS   . GERD   . IBS (irritable bowel syndrome)   . Lumbar herniated disc    not a problem now  . Other postablative hypothyroidism 07/2009   I-131ablation 07/2009 for hyperthyroid  . Rosacea     Past Surgical History:  Procedure Laterality Date  . EUS N/A 05/04/2015   Procedure: UPPER ENDOSCOPIC ULTRASOUND (EUS) LINEAR;  Surgeon: Carol Ada, MD;  Location: [facility];  Service: Endoscopy;   Laterality: N/A;  . EUS N/A 06/15/2015   Procedure: UPPER ENDOSCOPIC ULTRASOUND (EUS) LINEAR;  Surgeon: Carol Ada, MD;  Location: [facility];  Service: Endoscopy;  Laterality: N/A;  . LAPAROSCOPIC GASTRIC RESECTION N/A 08/03/2015   Procedure: LAPAROSCOPIC GASTROTOMY WITH EXCISION OF  GASTIC TUMOR;  Surgeon: Jackolyn Confer, MD;  Location: [facility];  Service: General;  Laterality: N/A;  . TONSILLECTOMY    . TUBAL LIGATION  1998    Current Outpatient Medications  Medication Sig Dispense Refill  . levothyroxine (SYNTHROID, LEVOTHROID) 112 MCG tablet Take 1 tablet (112 mcg total) by mouth daily. 30 tablet 0   No current facility-administered medications for this visit.     Family History  Problem Relation Age of Onset  . Mental illness Mother   . Lung disease Mother        lung fibrosis  . Breast cancer Maternal Aunt   . Diabetes Father 24  . Colon cancer Paternal Uncle   . Dementia Maternal Grandmother   . Breast cancer Cousin 105  . Cancer Other        cancer of tongue  . Multiple births Sister        3 sets of twins  . Cancer Maternal Uncle  unsure type  . Diabetes Paternal Grandmother     Review of Systems  All other systems reviewed and are negative.   Exam:   BP 100/66 (BP Location: Left Arm, Patient Position: Sitting, Cuff Size: Large)   Pulse 76   Resp 14   Ht 5\' 6"  (1.676 m)   Wt 155 lb (70.3 kg)   LMP 04/16/2017 (Approximate)   BMI 25.02 kg/m   Height: 5\' 6"  (167.6 cm)  Ht Readings from Last 3 Encounters:  05/25/17 5\' 6"  (1.676 m)  05/08/16 5\' 6"  (1.676 m)  10/18/15 5\' 5"  (1.651 m)    General appearance: alert, cooperative and appears stated age Head: Normocephalic, without obvious abnormality, atraumatic Neck: no adenopathy, supple, symmetrical, trachea midline and thyroid normal to inspection and palpation Lungs: clear to auscultation bilaterally Breasts: normal appearance, no masses or tenderness Heart: regular rate and rhythm Abdomen: soft,  non-tender; bowel sounds normal; no masses,  no organomegaly Extremities: extremities normal, atraumatic, no cyanosis or edema Skin: Skin color, texture, turgor normal. No rashes or lesions Lymph nodes: Cervical, supraclavicular, and axillary nodes normal. No abnormal inguinal nodes palpated Neurologic: Grossly normal   Pelvic: External genitalia:  no lesions              Urethra:  normal appearing urethra with no masses, tenderness or lesions              Bartholins and Skenes: normal                 Vagina: normal appearing vagina with normal color and discharge, no lesions              Cervix: no lesions              Pap taken: Yes.   Bimanual Exam:  Uterus:  normal size, contour, position, consistency, mobility, non-tender              Adnexa: normal adnexa and no mass, fullness, tenderness               Rectovaginal: Declines  Chaperone was present for exam.  A:  Well Woman with normal exam H/o menorrhagia and iron deficiency anemia, s/p iron transfusions x 2 IBS Hypothyroidism Family xh of breast cancer in maternal aunt  P:   Mammogram guidelines reviewed.  Aware this is overdue.  Will schedule for pt pap smear obtained today CBC and TSH will be obtained today Tender nodule on foot, feels like a cyst beneath a tendon.  Will refer to podiatry RF for synthroid 119mcg qd.  #90/4RF. return annually or prn

## 2017-05-26 ENCOUNTER — Other Ambulatory Visit: Payer: Self-pay | Admitting: Obstetrics & Gynecology

## 2017-05-26 ENCOUNTER — Telehealth: Payer: Self-pay | Admitting: *Deleted

## 2017-05-26 DIAGNOSIS — Z1231 Encounter for screening mammogram for malignant neoplasm of breast: Secondary | ICD-10-CM

## 2017-05-26 LAB — CBC
Hematocrit: 39.2 % (ref 34.0–46.6)
Hemoglobin: 13.1 g/dL (ref 11.1–15.9)
MCH: 32 pg (ref 26.6–33.0)
MCHC: 33.4 g/dL (ref 31.5–35.7)
MCV: 96 fL (ref 79–97)
PLATELETS: 313 10*3/uL (ref 150–379)
RBC: 4.1 x10E6/uL (ref 3.77–5.28)
RDW: 14.3 % (ref 12.3–15.4)
WBC: 7.7 10*3/uL (ref 3.4–10.8)

## 2017-05-26 LAB — CYTOLOGY - PAP: DIAGNOSIS: NEGATIVE

## 2017-05-26 LAB — TSH: TSH: 13.13 u[IU]/mL — AB (ref 0.450–4.500)

## 2017-05-26 NOTE — Telephone Encounter (Signed)
Called patient. Notified of MMG appt for Friday May 17th. Check in at 7:50am. appt at 8:10am.   Verbalized understanding.

## 2017-05-27 ENCOUNTER — Telehealth: Payer: Self-pay | Admitting: *Deleted

## 2017-05-27 DIAGNOSIS — R7989 Other specified abnormal findings of blood chemistry: Secondary | ICD-10-CM

## 2017-05-27 NOTE — Telephone Encounter (Signed)
Notes recorded by Burnice Logan, RN on 05/27/2017 at 11:38 AM EDT Left message to call Sharee Pimple at [phone].

## 2017-05-27 NOTE — Telephone Encounter (Signed)
-----   Message from Megan Salon, MD sent at 05/27/2017  8:39 AM EDT ----- Please let pt know her CBC was normal.  Pap was negative.  02 recall.  TSH was elevated.  Needs to increase synthroid to 196mcg daily and repeat TSH with free T4 in three months.  Orders have not been placed.

## 2017-05-28 MED ORDER — LEVOTHYROXINE SODIUM 125 MCG PO TABS: 125.0000 ug | ORAL_TABLET | Freq: Every day | ORAL | 0 refills | Status: DC

## 2017-05-28 NOTE — Telephone Encounter (Signed)
Spoke with patient, advised as seen below per Dr. Sabra Heck. Rx for synthroid 125 mcg #90/0RF to verified pharmacy. Lab appt scheduled for 09/03/17 at 8:30am. Patient verbalizes understanding.  Future lab orders placed for TSH and FreeT4.   Next AEX 08/24/18  Will close encounter.

## 2017-05-28 NOTE — Telephone Encounter (Signed)
Return call to Jill. °

## 2017-05-29 ENCOUNTER — Ambulatory Visit: Payer: BLUE CROSS/BLUE SHIELD

## 2017-06-05 ENCOUNTER — Ambulatory Visit (INDEPENDENT_AMBULATORY_CARE_PROVIDER_SITE_OTHER): Payer: BLUE CROSS/BLUE SHIELD

## 2017-06-05 ENCOUNTER — Encounter: Payer: Self-pay | Admitting: Podiatry

## 2017-06-05 ENCOUNTER — Ambulatory Visit (INDEPENDENT_AMBULATORY_CARE_PROVIDER_SITE_OTHER): Payer: BLUE CROSS/BLUE SHIELD | Admitting: Podiatry

## 2017-06-05 DIAGNOSIS — M898X9 Other specified disorders of bone, unspecified site: Secondary | ICD-10-CM

## 2017-06-05 DIAGNOSIS — M6740 Ganglion, unspecified site: Secondary | ICD-10-CM

## 2017-06-05 NOTE — Progress Notes (Signed)
Subjective:    Patient ID: Kathryn Gregory, female    DOB: November 13, 1965, 52 y.o.   MRN: [id number]  HPI Kathryn Gregory presents to the office today for concerns of discomfort to the top of her right foot which has been ongoing since December 2018. She states that is is not painful but she can feel it. She is not sure if there is a cyst present. No recent injury.  She said no recent treatment for this.  She has no other concerns today.  Review of Systems  All other systems reviewed and are negative.  Past Medical History:  Diagnosis Date  . ANEMIA-NOS   . GERD   . IBS (irritable bowel syndrome)   . Lumbar herniated disc    not a problem now  . Other postablative hypothyroidism 07/2009   I-131ablation 07/2009 for hyperthyroid  . Rosacea     Past Surgical History:  Procedure Laterality Date  . EUS N/A 05/04/2015   Procedure: UPPER ENDOSCOPIC ULTRASOUND (EUS) LINEAR;  Surgeon: Carol Ada, MD;  Location: [facility];  Service: Endoscopy;  Laterality: N/A;  . EUS N/A 06/15/2015   Procedure: UPPER ENDOSCOPIC ULTRASOUND (EUS) LINEAR;  Surgeon: Carol Ada, MD;  Location: [facility];  Service: Endoscopy;  Laterality: N/A;  . LAPAROSCOPIC GASTRIC RESECTION N/A 08/03/2015   Procedure: LAPAROSCOPIC GASTROTOMY WITH EXCISION OF  GASTIC TUMOR;  Surgeon: Jackolyn Confer, MD;  Location: [facility];  Service: General;  Laterality: N/A;  . TONSILLECTOMY    . TUBAL LIGATION  1998     Current Outpatient Medications:  .  levothyroxine (SYNTHROID) 125 MCG tablet, Take 1 tablet (125 mcg total) by mouth daily before breakfast., Disp: 90 tablet, Rfl: 0  Allergies  Allergen Reactions  . Penicillins Other (See Comments)    Childhood allergy-unknown.  Has patient had a PCN reaction causing immediate rash, facial/tongue/throat swelling, SOB or lightheadedness with hypotension: no Has patient had a PCN reaction causing severe rash involving mucus membranes or skin necrosis: no Has patient had a PCN reaction that  required hospitalization yes - she was in the hospital when occurred per her mother Has patient had a PCN reaction occurring within the last 10 years: no If all of the above answers are "NO", then may procee         Objective:   Physical Exam  General: AAO x3, NAD  Dermatological: Skin is warm, dry and supple bilateral. Nails x 10 are well manicured; remaining integument appears unremarkable at this time. There are no open sores, no preulcerative lesions, no rash or signs of infection present.  Vascular: Dorsalis Pedis artery and Posterior Tibial artery pedal pulses are 2/4 bilateral with immedate capillary fill time. Pedal hair growth present. No varicosities and no lower extremity edema present bilateral. There is no pain with calf compression, swelling, warmth, erythema.   Neruologic: Grossly intact via light touch bilateral. Protective threshold with Semmes Wienstein monofilament intact to all pedal sites bilateral.   Musculoskeletal: There is a small area of bony exostosis palpable of the dorsal aspect of the right foot on the first metatarsal cuneiform joint.  No tenderness palpation identified but she states that she can feel it at times and is more uncomfortable and no pain.  There is no soft tissue mass present.  There is no overlying edema, erythema, increase in warmth.  There is no other areas of tenderness identified at this time.  Muscular strength 5/5 in all groups tested bilateral.  Gait: Unassisted, Nonantalgic.  Assessment & Plan:  52 year old female with small bony exostosis dorsal right midfoot -Treatment options discussed including all alternatives, risks, and complications -Etiology of symptoms were discussed -X-rays were obtained and reviewed with the patient.  Skin marker was utilized to identify the area of concern.  No evidence of acute fracture, foreign body and there is small spurs present. -We discussed shoe modifications to not put pressure on the area we  also discussed offloading mechanisms.  Discussed topical anti-inflammatory if needed.  We also discussed arch support to help take pressure off the foot to help prevent her from falling inwards.  Trula Slade DPM

## 2017-06-05 NOTE — Progress Notes (Signed)
   Subjective:    Patient ID: Kathryn Gregory, female    DOB: March 01, 1965, 52 y.o.   MRN: [id number]  HPI    Review of Systems     Objective:   Physical Exam        Assessment & Plan:

## 2017-06-11 ENCOUNTER — Ambulatory Visit
Admission: RE | Admit: 2017-06-11 | Discharge: 2017-06-11 | Disposition: A | Discharge status: Home / Self Care | Payer: BLUE CROSS/BLUE SHIELD | Source: Ambulatory Visit | Attending: Obstetrics & Gynecology | Admitting: Obstetrics & Gynecology

## 2017-06-11 DIAGNOSIS — Z1231 Encounter for screening mammogram for malignant neoplasm of breast: Secondary | ICD-10-CM | POA: Diagnosis not present

## 2017-07-25 DIAGNOSIS — H524 Presbyopia: Secondary | ICD-10-CM | POA: Diagnosis not present

## 2017-07-31 ENCOUNTER — Telehealth: Payer: Self-pay | Admitting: Obstetrics & Gynecology

## 2017-07-31 DIAGNOSIS — N926 Irregular menstruation, unspecified: Secondary | ICD-10-CM

## 2017-07-31 MED ORDER — NORETHINDRONE ACETATE 5 MG PO TABS: 10.0000 mg | ORAL_TABLET | Freq: Two times a day (BID) | ORAL | 0 refills | Status: DC

## 2017-07-31 NOTE — Telephone Encounter (Signed)
Reviewed with Dr. Sabra Heck.   Aygestin 10mg  bid until bleeding stops, then decrease to 10 mg daily. Needs OV with PUS as soon as she returns.   Spoke with patient, advised as seen above per Dr. Sabra Heck. Rx to verified pharmacy. Patient will return on 7/29. PUS scheduled for 8/1 at 12:30 pm, consult at 1pm with Dr. Sabra Heck. ER/Urgent care precautions reviewed if bleeding becomes heavier, new symptoms develop or worsen.   Order placed for PUS.   Routing to provider for final review. Patient is agreeable to disposition. Will close encounter.   Cc: Lerry Liner, Magdalene Patricia

## 2017-07-31 NOTE — Telephone Encounter (Signed)
Patient states she has been bleeding for 8 days and would like to know what she should do. Please advise.

## 2017-07-31 NOTE — Telephone Encounter (Signed)
Spoke with patient. Reports menses started 07/24/17 and has not stopped to date. Has changed 3 pads today, not completely saturated. Occasional menses cramps. Denies SOB, lightheadedness, dizziness or weakness. Tubal ligation for contraceptive. Hx of anemia, has received iron transfusions in the past. Patient requesting RX to stop bleeding. Currently on vacation in [place].   Advised will review with Dr. Sabra Heck and return call.   Dr. Sabra Heck -please advise.

## 2017-08-10 ENCOUNTER — Other Ambulatory Visit: Payer: Self-pay | Admitting: Obstetrics & Gynecology

## 2017-08-10 NOTE — Telephone Encounter (Signed)
Medication refill request: norethindrone 5mg  tab  Last AEX:   05/25/17 with SM  Next AEX: 08/24/18 Last MMG (if hormonal medication request): 06/01/17 Bi-rads category 1 neg  Refill authorized: please refill if appropriate.

## 2017-08-10 NOTE — Telephone Encounter (Signed)
Forwarded to Dr. Sabra Heck, patient's primary gynecologist.

## 2017-08-11 ENCOUNTER — Telehealth: Payer: Self-pay | Admitting: Obstetrics & Gynecology

## 2017-08-11 MED ORDER — NORETHINDRONE ACETATE 5 MG PO TABS: 10.0000 mg | ORAL_TABLET | Freq: Two times a day (BID) | ORAL | 0 refills | Status: DC

## 2017-08-11 NOTE — Telephone Encounter (Signed)
Spoke with patient. Requesting refill of aygestin. Had reduced to 10 mg daily on 7/25. Ran out of medication on 7/29, spotting started on 7/30, is scheduled for PUS on 8/1. Denies any other symptoms.   Advised will review with Dr. Sabra Heck and return call, patient agreeable.   Dr. Sabra Heck -please advise on aygestin refill.

## 2017-08-11 NOTE — Telephone Encounter (Signed)
Rx Aygestin to pharmacy. Call returned to patient, advised per Dr. Sabra Heck. Patient verbalizes understanding and is agreeable. Encounter closed.

## 2017-08-11 NOTE — Telephone Encounter (Signed)
Patient has started spotting today after stopping her medication for the ultrasound on Thursday.

## 2017-08-11 NOTE — Telephone Encounter (Signed)
It's ok to refill this prescription.  I thought that she had enough until the ultrasound on Thursday.  Thanks.

## 2017-08-13 ENCOUNTER — Encounter: Payer: Self-pay | Admitting: Obstetrics & Gynecology

## 2017-08-13 ENCOUNTER — Ambulatory Visit (INDEPENDENT_AMBULATORY_CARE_PROVIDER_SITE_OTHER): Payer: BLUE CROSS/BLUE SHIELD

## 2017-08-13 ENCOUNTER — Ambulatory Visit (INDEPENDENT_AMBULATORY_CARE_PROVIDER_SITE_OTHER): Payer: BLUE CROSS/BLUE SHIELD | Admitting: Obstetrics & Gynecology

## 2017-08-13 VITALS — BP 118/70 | HR 80 | Resp 16 | Ht 66.0 in | Wt 165.0 lb

## 2017-08-13 DIAGNOSIS — N926 Irregular menstruation, unspecified: Secondary | ICD-10-CM

## 2017-08-13 DIAGNOSIS — R9389 Abnormal findings on diagnostic imaging of other specified body structures: Secondary | ICD-10-CM | POA: Diagnosis not present

## 2017-08-13 DIAGNOSIS — E039 Hypothyroidism, unspecified: Secondary | ICD-10-CM | POA: Diagnosis not present

## 2017-08-13 DIAGNOSIS — N921 Excessive and frequent menstruation with irregular cycle: Secondary | ICD-10-CM

## 2017-08-13 DIAGNOSIS — D500 Iron deficiency anemia secondary to blood loss (chronic): Secondary | ICD-10-CM | POA: Diagnosis not present

## 2017-08-13 NOTE — Progress Notes (Signed)
52 y.o. G62P1102 Married Hispanic female here for pelvic ultrasound due to menorrhagia.  This has been an ongoing issue for her.  She's had to receive iron infusions x 2 due to iron deficiency anemia.  Last cycle prior to this one was 5/22 -27.  Flow lasted 5 days but was very heavy with large clots.  It did stop on it's own without any medication.  Then she started bleeding again on 7/11 and this lasted until the 19th.  The flow was heavy and not with clots.  Aygestin was started and the bleeding stopped.  She ran out of the aygestin and started bleeding again so is back on the 10mg  bid dosing.  She does have follow up with [facility], hematology, and does have blood work scheduled.  Will do this today.  Needs thyroid repeated as well.    Patient's last menstrual period was 07/23/2017.  Contraception: BTL  Findings:  UTERUS: 8.6 x 5.3 x 4.4cm EMS:12.28mm ADNEXA: Left ovary: 1.9 x 1.2 x 5.9DG with 35mm follicle       Right ovary: 1.6 x 1.4 x 1.0cm with 16mm CUL DE SAC: no free fluid  Discussion:  Findings reviewed with pt.  12.18mm endometrium reviewed.  Last biopsy was 08/29/16.  Pt not interested in surgery but willing to consider Mirena IUD and D&C if needed for treatment.  Does not want to be out of work for any length of time.  IUD, oral progesterone methods, hysteroscopy with D&C reviewed.  Pt would like to know costs.  If IUD covered, I would repeat her endometrial biopsy at that same time.  Pt has follow up appt with hematology on Monday.  Will go ahead and obtian labs today so they are complete for the appt.    Assessment:  Menorrhagia Thickened endometrium Hypothyroidism H/O iron deficiency anemia  Plan:  Considering hysteroscopy with D&C and Mirena IUD.  Will see about costs. TSH and free T4 obtained today. Iron, TIBC, Ferritin and CBC with diff obtained today. Will check about insurance coverage for her for above options  ~30 minutes spent with patient >50% of time was in  face to face discussion of above.

## 2017-08-14 LAB — CBC WITH DIFFERENTIAL/PLATELET
BASOS ABS: 0 10*3/uL (ref 0.0–0.2)
BASOS: 0 %
EOS (ABSOLUTE): 0.2 10*3/uL (ref 0.0–0.4)
Eos: 2 %
Hematocrit: 36.6 % (ref 34.0–46.6)
Hemoglobin: 11.5 g/dL (ref 11.1–15.9)
IMMATURE GRANS (ABS): 0 10*3/uL (ref 0.0–0.1)
IMMATURE GRANULOCYTES: 0 %
LYMPHS: 26 %
Lymphocytes Absolute: 2 10*3/uL (ref 0.7–3.1)
MCH: 31.1 pg (ref 26.6–33.0)
MCHC: 31.4 g/dL — ABNORMAL LOW (ref 31.5–35.7)
MCV: 99 fL — AB (ref 79–97)
MONOS ABS: 0.4 10*3/uL (ref 0.1–0.9)
Monocytes: 6 %
NEUTROS PCT: 66 %
Neutrophils Absolute: 5.2 10*3/uL (ref 1.4–7.0)
PLATELETS: 330 10*3/uL (ref 150–450)
RBC: 3.7 x10E6/uL — AB (ref 3.77–5.28)
RDW: 13.3 % (ref 12.3–15.4)
WBC: 7.8 10*3/uL (ref 3.4–10.8)

## 2017-08-14 LAB — IRON,TIBC AND FERRITIN PANEL
FERRITIN: 17 ng/mL (ref 15–150)
IRON SATURATION: 11 % — AB (ref 15–55)
IRON: 38 ug/dL (ref 27–159)
Total Iron Binding Capacity: 350 ug/dL (ref 250–450)
UIBC: 312 ug/dL (ref 131–425)

## 2017-08-14 LAB — T4, FREE: Free T4: 1.47 ng/dL (ref 0.82–1.77)

## 2017-08-14 LAB — TSH: TSH: 5.3 u[IU]/mL — AB (ref 0.450–4.500)

## 2017-08-17 ENCOUNTER — Telehealth: Payer: Self-pay | Admitting: Obstetrics & Gynecology

## 2017-08-17 ENCOUNTER — Inpatient Hospital Stay: Payer: BLUE CROSS/BLUE SHIELD

## 2017-08-17 ENCOUNTER — Other Ambulatory Visit: Payer: Self-pay

## 2017-08-17 ENCOUNTER — Encounter: Payer: Self-pay | Admitting: Family

## 2017-08-17 ENCOUNTER — Inpatient Hospital Stay: Payer: BLUE CROSS/BLUE SHIELD | Attending: Family | Admitting: Family

## 2017-08-17 VITALS — BP 124/72 | HR 68 | Temp 98.5°F | Resp 16 | Wt 162.0 lb

## 2017-08-17 DIAGNOSIS — N920 Excessive and frequent menstruation with regular cycle: Secondary | ICD-10-CM | POA: Diagnosis not present

## 2017-08-17 DIAGNOSIS — D508 Other iron deficiency anemias: Secondary | ICD-10-CM | POA: Insufficient documentation

## 2017-08-17 DIAGNOSIS — D500 Iron deficiency anemia secondary to blood loss (chronic): Secondary | ICD-10-CM

## 2017-08-17 NOTE — Progress Notes (Signed)
Hematology and Oncology Follow Up Visit  Kathryn Gregory [id number] 14-Nov-1965 52 y.o. 08/17/2017   Principle Diagnosis:  Iron deficiency anemia secondary to chronic blood loss with heavy cycles  Current Therapy:   IV iron as indicated - last received in October 2018 x 2   Interim History:  Kathryn Gregory is here today for follow-up. She hs had some mild fatigue. Iron saturation is down at 11%.  She continues to have a heavy cycles once every 35-40 days. Her gynecologist has ordered an IUD be placed and she is waiting on prior authorization with her insurance.  She has had no other episodes of bleeding, no bruising or petechial rash.  No fever, chills, n/v, cough, rash, dizziness, SOB, chest pain, palpitations, abdominal pain or changes in bowel or bladder habits.  No swelling, tenderness, numbness or tingling in her extremities.  She has a good appetite and is staying well hydrated. Her weight is stable.   ECOG Performance Status: 1 - Symptomatic but completely ambulatory  Medications:  Allergies as of 08/17/2017      Reactions   Penicillins Other (See Comments)   Childhood allergy--unknown reaction      Medication List        Accurate as of 08/17/17 10:08 AM. Always use your most recent med list.          levothyroxine 125 MCG tablet Commonly known as:  SYNTHROID Take 1 tablet (125 mcg total) by mouth daily before breakfast.   norethindrone 5 MG tablet Commonly known as:  AYGESTIN Take 2 tablets (10 mg total) by mouth 2 (two) times daily. Until bleeding stops, then decrease to 10 mg daily.       Allergies:  Allergies  Allergen Reactions  . Penicillins Other (See Comments)    Childhood allergy--unknown reaction    Past Medical History, Surgical history, Social history, and Family History were reviewed and updated.  Review of Systems: All other 10 point review of systems is negative.   Physical Exam:  weight is 162 lb (73.5 kg). Her oral temperature is 98.5 F (36.9  C). Her blood pressure is 124/72 and her pulse is 68. Her respiration is 16 and oxygen saturation is 100%.   Wt Readings from Last 3 Encounters:  08/17/17 162 lb (73.5 kg)  08/13/17 165 lb (74.8 kg)  05/25/17 155 lb (70.3 kg)    Ocular: Sclerae unicteric, pupils equal, round and reactive to light Ear-nose-throat: Oropharynx clear, dentition fair Lymphatic: No cervical, supraclavicular or axillary adenopathy Lungs no rales or rhonchi, good excursion bilaterally Heart regular rate and rhythm, no murmur appreciated Abd soft, nontender, positive bowel sounds, no liver or spleen tip palpated on exam, no fluid wave  MSK no focal spinal tenderness, no joint edema Neuro: non-focal, well-oriented, appropriate affect Breasts: Deferred   Lab Results  Component Value Date   WBC 7.8 08/13/2017   HGB 11.5 08/13/2017   HCT 36.6 08/13/2017   MCV 99 (H) 08/13/2017   PLT 330 08/13/2017   Lab Results  Component Value Date   FERRITIN 17 08/13/2017   IRON 38 08/13/2017   TIBC 350 08/13/2017   UIBC 312 08/13/2017   IRONPCTSAT 11 (L) 08/13/2017   Lab Results  Component Value Date   RBC 3.70 (L) 08/13/2017   No results found for: KPAFRELGTCHN, LAMBDASER, KAPLAMBRATIO No results found for: IGGSERUM, IGA, IGMSERUM No results found for: TOTALPROTELP, ALBUMINELP, A1GS, A2GS, BETS, BETA2SER, GAMS, MSPIKE, SPEI   Chemistry      Component Value Date/Time  NA 139 10/06/2016 1502   K 4.3 10/06/2016 1502   CL 104 10/06/2016 1502   CO2 27 10/06/2016 1502   BUN 14 10/06/2016 1502   CREATININE 0.59 10/06/2016 1502   CREATININE 0.64 05/08/2016 1434      Component Value Date/Time   CALCIUM 9.1 10/06/2016 1502   ALKPHOS 78 10/06/2016 1502   AST 23 10/06/2016 1502   ALT 18 10/06/2016 1502   BILITOT 0.2 10/06/2016 1502      Impression and Plan: Kathryn Gregory is very pleasant 52 yo [place] female with iron deficiency anemia secondary to heavy cycles. She has some fatigue with iron saturation of  11%.  Her gynecologist is working on getting her an IUD. Hopefully this will help lessen her cycle.  We will get her set up for one dose of IV iron later this week.  We will plan to see her back in another 8 weeks for follow-up.  She will contact our office with any questions or concerns. We can certainly see her sooner if need be.   Kathryn Peace, NP 8/5/201910:08 AM

## 2017-08-17 NOTE — Telephone Encounter (Signed)
Spoke with patient in regards to benefits for a possible Mirena IUD insertion and a possible hysteroscopy and D&C. Patient understood information presented. Patient advises she is leaning toward a Mirena IUD, but advises she is scheduled for a "blood infusion" this week and would like to consider options once this appointment has been completed. States she will call when ready to proceed.  Forwarding to Dr Sabra Heck for final review. Patient is agreeable to disposition. Will close encounter

## 2017-08-20 ENCOUNTER — Telehealth: Payer: Self-pay | Admitting: Obstetrics & Gynecology

## 2017-08-20 ENCOUNTER — Inpatient Hospital Stay: Payer: BLUE CROSS/BLUE SHIELD

## 2017-08-20 VITALS — BP 102/63 | HR 67 | Temp 98.1°F | Resp 16

## 2017-08-20 DIAGNOSIS — D500 Iron deficiency anemia secondary to blood loss (chronic): Secondary | ICD-10-CM

## 2017-08-20 DIAGNOSIS — N920 Excessive and frequent menstruation with regular cycle: Secondary | ICD-10-CM | POA: Diagnosis not present

## 2017-08-20 DIAGNOSIS — D508 Other iron deficiency anemias: Secondary | ICD-10-CM | POA: Diagnosis not present

## 2017-08-20 MED ORDER — NORETHINDRONE ACETATE 5 MG PO TABS: 10.0000 mg | ORAL_TABLET | Freq: Two times a day (BID) | ORAL | 0 refills | Status: DC

## 2017-08-20 MED ORDER — SODIUM CHLORIDE 0.9 % IV SOLN
510.0000 mg | Freq: Once | INTRAVENOUS | Status: AC
  Administered 2017-08-20: 510 mg via INTRAVENOUS
  Filled 2017-08-20: qty 17

## 2017-08-20 MED ORDER — SODIUM CHLORIDE 0.9 % IV SOLN
INTRAVENOUS | Status: DC
  Administered 2017-08-20: 15:00:00 via INTRAVENOUS
  Filled 2017-08-20: qty 250

## 2017-08-20 NOTE — Telephone Encounter (Signed)
Spoke with patient.  Requesting refill of Aygestin. She missed her Tuesday and started having some spotting yesterday, today, bleeding and has soaked a pad in 4 hours, reports small clots. Denies dizziness, weakness, sob, CP or lightheaded feelings.    She has increased her dose of Aygestin to two tablets BID. Office visit offered and patient declines, has iron infusion today at [facility].   She reports she is unable to schedule Dilatation and Curettage Hysteroscopy/Mirena IUD at this time.   Spoke with Dr. Sabra Heck okay to refill Aygestin 10 mg PO BID until bleeding stops and then Aygestin 10 mg po daily. Rx sent to file pharmacy.   Advised patient: Call back if bleeding continues or is soaking pads or if any abdominal pain or cramping.  Advised I would call back if any additional instructions from provider. Advised can take 600-800 mg Motrin q 8 hours for up to 48 hours to help decrease bleeding. She verbalizes understanding of symptoms of bleeding emergencies and when to call back to office or go to urgent care or emergency room as needed. Patient is agreeable and verbalizes understanding of plan and will call back if condition worsens or any further concerns.  Encouraged patient to plan procedures to help stop bleeding as ordered by Dr. Sabra Heck. Pt aware.   Routing to Dr. Sabra Heck to review and sign. Will close encounter.

## 2017-08-20 NOTE — Telephone Encounter (Signed)
Patient called and requested to speak with the nurse. She said she has "started bleeding too much" and wants some guidance.  Last seen: 08/13/17

## 2017-08-20 NOTE — Patient Instructions (Signed)

## 2017-08-28 ENCOUNTER — Other Ambulatory Visit: Payer: Self-pay | Admitting: Obstetrics & Gynecology

## 2017-08-28 NOTE — Telephone Encounter (Signed)
Medication refill request: aygestin 5mg  Last AEX:  05-25-17 Next AEX: 08-24-18 Last MMG (if hormonal medication request): 06-11-17 category c density birads 1:neg Refill authorized: last refilled 08-20-17. please approve if appropriate

## 2017-08-28 NOTE — Telephone Encounter (Signed)
Please contact patient to understand what is the status of her vaginal bleeding.  Dr. Sabra Heck recommended Aygestin 10 mg po bid until bleeding stopped and then Aygestin 10 mg daily.   CC - [employer]

## 2017-08-28 NOTE — Telephone Encounter (Signed)
Message left to return call to Rayven Hendrickson at [phone].    

## 2017-08-28 NOTE — Telephone Encounter (Signed)
Patient returned call. She is still having spotting so she is taking 4 tablets per day.   She has 4 tablets left.  Denies dizziness, weakness, lightheaded feelings.  Has been waiting to plan for meeting insurance deductible.  Office visit with Dr. Sabra Heck for follow up scheduled for Thursday at 1000. Pt will call if develops increased bleeding prior to appointment.   Needs refill to last until office visit with Dr. Sabra Heck.

## 2017-09-03 ENCOUNTER — Encounter: Payer: Self-pay | Admitting: Obstetrics & Gynecology

## 2017-09-03 ENCOUNTER — Ambulatory Visit (INDEPENDENT_AMBULATORY_CARE_PROVIDER_SITE_OTHER): Payer: BLUE CROSS/BLUE SHIELD | Admitting: Obstetrics & Gynecology

## 2017-09-03 ENCOUNTER — Other Ambulatory Visit: Payer: Self-pay

## 2017-09-03 VITALS — BP 106/72 | HR 68 | Resp 16 | Ht 66.0 in | Wt 159.2 lb

## 2017-09-03 DIAGNOSIS — N920 Excessive and frequent menstruation with regular cycle: Secondary | ICD-10-CM | POA: Diagnosis not present

## 2017-09-03 DIAGNOSIS — N840 Polyp of corpus uteri: Secondary | ICD-10-CM | POA: Diagnosis not present

## 2017-09-03 DIAGNOSIS — N841 Polyp of cervix uteri: Secondary | ICD-10-CM

## 2017-09-03 DIAGNOSIS — R9389 Abnormal findings on diagnostic imaging of other specified body structures: Secondary | ICD-10-CM

## 2017-09-03 DIAGNOSIS — D500 Iron deficiency anemia secondary to blood loss (chronic): Secondary | ICD-10-CM

## 2017-09-03 MED ORDER — NORETHINDRONE ACETATE 5 MG PO TABS
10.0000 mg | ORAL_TABLET | Freq: Two times a day (BID) | ORAL | 0 refills | Status: DC
Start: 2017-09-03 — End: 2017-10-16

## 2017-09-03 NOTE — Progress Notes (Signed)
52 y.o. G37P1102 Married Hispanic female presents for insertion of Mirena IUD and endometrial biopsy prior to the IUD insertion due to menorrhagia, iron deficiency anemia, and history of iron transfusions.  She is on aygestin daily is still having light spotting.    We have discussed alternative treatment options but she does not want any OR procedures if at all possible.  Pt has also been counseled about risks and benefits as well as complications.  Consent is obtained today.  All questions answered prior to start of procedure.    Current contraception: BLT Last STD testing:  Long term monogamous relationship LMP:  Patient's last menstrual period was 08/19/2017 (approximate).  Patient Active Problem List   Diagnosis Date Noted  . Iron deficiency anemia due to chronic blood loss 10/07/2016  . Fungal infection of toenail 10/18/2015  . Gastric mass s/p excision 08/02/15 08/03/2015  . Ventral hernia 05/01/2014  . Routine general medical examination at a health care facility 05/01/2014  . Postablative hypothyroidism 09/06/2009  . ANEMIA-NOS 05/01/2009  . HEARING LOSS 05/01/2009  . GERD 05/01/2009  . ROSACEA 05/01/2009  . LOW BACK PAIN, CHRONIC 05/01/2009   Past Medical History:  Diagnosis Date  . ANEMIA-NOS   . GERD   . IBS (irritable bowel syndrome)   . Lumbar herniated disc    not a problem now  . Other postablative hypothyroidism 07/2009   I-131ablation 07/2009 for hyperthyroid  . Rosacea    Current Outpatient Medications on File Prior to Visit  Medication Sig Dispense Refill  . levothyroxine (SYNTHROID) 125 MCG tablet Take 1 tablet (125 mcg total) by mouth daily before breakfast. 90 tablet 0   No current facility-administered medications on file prior to visit.    Penicillins  Review of Systems  Genitourinary:       Irregular spotting  All other systems reviewed and are negative.  Vitals:   09/03/17 0954  BP: 106/72  Pulse: 68  Resp: 16  Weight: 159 lb 3.2 oz (72.2 kg)   Height: 5\' 6"  (1.676 m)    Gen:  [facility] healthy female NAD Abdomen: soft, non-tender Groin:  no inguinal nodes palpated  Pelvic exam: Vulva:  normal female genitalia Vagina:  normal vagina Cervix:  Non-tender, Negative CMT, no lesions or redness, prolapsed cervical polyp noted Uterus:  normal shape, position and consistency   Procedure:  Speculum reinserted.  Cervix visualized and cleansed with Betadine x 3.  A single toothed tenaculum was not applied to the anterior lip of the cervix.  The polyp was grasped with a ringed forceps and removed by twisting at the base until it was fully removed.  Then the endometrial pipelle was advanced through the cervix into the endometrial cavity without difficulty.  Pipelle passed to 8.5cm.  Suction applied and pipelle removed with good tissue sample obtained.  Second pass performed due to the amount of tissue present.  Then, I proceeded with IUD placement.  Uterus sounded to 8.5cm with endometrial biopsy.  Lot number: [id number].  Expiration:  11/2019.  IUD package was opened.  IUD and introducer passed to fundus and then withdrawn slightly before IUD was passed into endometrial cavity.  Introducer removed.  Strings cut to 2cm.  Tenaculum removed from cervix.  Minimal bleeding noted.  Pt tolerated the procedure well.  All instruments removed from vagina.  A: Menorrhagia Cervical polyp, s/p removal today Insertion of Mirena IUD H/O anemia and iron deficiency  P:  Return for recheck 6-8 weeks Pt is going to stay on  aygestin for 4 more weeks before stopping as she will likely have a cycle and I would like her bleeding to hopefully be improved prior to her next cycle due to risks for expulsion if flow is very heavy Pathology for polyp and endometrium will be called to pt Pt aware to call for any concerns Pt aware removal due no later than 09/04/2022.  IUD card given to pt.

## 2017-09-03 NOTE — Progress Notes (Deleted)
GYNECOLOGY  VISIT  CC:   ***  HPI: 52 y.o. G50P1102 Married Hispanic female here for follow up irregular bleeding.  GYNECOLOGIC HISTORY: Patient's last menstrual period was 08/19/2017 (approximate). Contraception: BTL Menopausal hormone therapy: none  Patient Active Problem List   Diagnosis Date Noted  . Iron deficiency anemia due to chronic blood loss 10/07/2016  . Fungal infection of toenail 10/18/2015  . Gastric mass s/p excision 08/02/15 08/03/2015  . Ventral hernia 05/01/2014  . Routine general medical examination at a health care facility 05/01/2014  . Postablative hypothyroidism 09/06/2009  . ANEMIA-NOS 05/01/2009  . HEARING LOSS 05/01/2009  . GERD 05/01/2009  . ROSACEA 05/01/2009  . LOW BACK PAIN, CHRONIC 05/01/2009    Past Medical History:  Diagnosis Date  . ANEMIA-NOS   . GERD   . IBS (irritable bowel syndrome)   . Lumbar herniated disc    not a problem now  . Other postablative hypothyroidism 07/2009   I-131ablation 07/2009 for hyperthyroid  . Rosacea     Past Surgical History:  Procedure Laterality Date  . EUS N/A 05/04/2015   Procedure: UPPER ENDOSCOPIC ULTRASOUND (EUS) LINEAR;  Surgeon: Carol Ada, MD;  Location: [facility];  Service: Endoscopy;  Laterality: N/A;  . EUS N/A 06/15/2015   Procedure: UPPER ENDOSCOPIC ULTRASOUND (EUS) LINEAR;  Surgeon: Carol Ada, MD;  Location: [facility];  Service: Endoscopy;  Laterality: N/A;  . LAPAROSCOPIC GASTRIC RESECTION N/A 08/03/2015   Procedure: LAPAROSCOPIC GASTROTOMY WITH EXCISION OF  GASTIC TUMOR;  Surgeon: Jackolyn Confer, MD;  Location: [facility] ORS;  Service: General;  Laterality: N/A;  . TONSILLECTOMY    . TUBAL LIGATION  1998    MEDS:   Current Outpatient Medications on File Prior to Visit  Medication Sig Dispense Refill  . levothyroxine (SYNTHROID) 125 MCG tablet Take 1 tablet (125 mcg total) by mouth daily before breakfast. 90 tablet 0  . norethindrone (AYGESTIN) 5 MG tablet TAKE 2 TABLETS BY MOUTH TWICE  DAILY UNTIL  BLEEDING  STOPS  ,  THEN  DECREASE  TO  2  TABLETS  ONCE  DAILY 30 tablet 0   No current facility-administered medications on file prior to visit.     ALLERGIES: Penicillins  Family History  Problem Relation Age of Onset  . Mental illness Mother   . Lung disease Mother        lung fibrosis  . Breast cancer Maternal Aunt   . Diabetes Father 21  . Colon cancer Paternal Uncle   . Dementia Maternal Grandmother   . Breast cancer Cousin 55  . Cancer Other        cancer of tongue  . Multiple births Sister        3 sets of twins  . Cancer Maternal Uncle        unsure type  . Diabetes Paternal Grandmother     SH:  ***  Review of Systems  Constitutional: Positive for chills.  All other systems reviewed and are negative.   PHYSICAL EXAMINATION:    BP 106/72 (BP Location: Right Arm, Patient Position: Sitting, Cuff Size: Normal)   Pulse 68   Resp 16   Ht 5\' 6"  (1.676 m)   Wt 159 lb 3.2 oz (72.2 kg)   LMP 08/19/2017 (Approximate)   BMI 25.70 kg/m     General appearance: alert, cooperative and appears stated age Neck: no adenopathy, supple, symmetrical, trachea midline and thyroid {CHL AMB PHY EX THYROID NORM [phone]::"normal to inspection and palpation"} CV:  {Exam; heart  brief:31539} Lungs:  {pe lungs ob:314451::"clear to auscultation, no wheezes, rales or rhonchi, symmetric air entry"} Breasts: {Exam; breast:13139::"normal appearance, no masses or tenderness"} Abdomen: soft, non-tender; bowel sounds normal; no masses,  no organomegaly Lymph:  no inguinal LAD noted  Pelvic: External genitalia:  no lesions              Urethra:  normal appearing urethra with no masses, tenderness or lesions              Bartholins and Skenes: normal                 Vagina: normal appearing vagina with normal color and discharge, no lesions              Cervix: {CHL AMB PHY EX CERVIX NORM DEFAULT:[phone]::"no lesions"}              Bimanual Exam:  Uterus:  {CHL AMB  PHY EX UTERUS NORM [phone]::"normal size, contour, position, consistency, mobility, non-tender"}              Adnexa: {CHL AMB PHY EX ADNEXA NO MASS DEFAULT:[phone]::"no mass, fullness, tenderness"}              Rectovaginal: {yes no:314532}.  Confirms.              Anus:  normal sphincter tone, no lesions  Chaperone was present for exam.  Assessment: ***  Plan: ***   ~{NUMBERS; -10-45 JOINT ROM:10287} minutes spent with patient >50% of time was in face to face discussion of above.

## 2017-09-08 ENCOUNTER — Telehealth: Payer: Self-pay | Admitting: Emergency Medicine

## 2017-09-08 NOTE — Telephone Encounter (Signed)
Pt notified and encounter closed. Agreeable to plan.

## 2017-09-08 NOTE — Telephone Encounter (Signed)
Call to patient. Path results given and pt verbalized understanding.  She would like to know if Dr. Sabra Heck recommends pt to have polyp removed at this time. If so, she would like to move forward with procedure as she has met her deductible until 12/13/17. Pt having spotting now, no heavy bleeding. Still taking Aygestin as prescribed.    Advised likely will need to see how Mirena is working for her before planning a procedure, however, would send message to Dr. Sabra Heck to be sure. Pt agreeable.   Follow up appointment on 11/05/17 with Dr. Sabra Heck.

## 2017-09-08 NOTE — Telephone Encounter (Signed)
I removed the big polyp in the office so this does not need any additional treatment.  I think the IUD will help the bleeding a lot.  If she has a lot of spotting after having the IUD for three months, then we can consider the hysteroscopy.  For now, I just want to see how much better her bleeding is with the IUD.  Thanks.

## 2017-09-08 NOTE — Telephone Encounter (Signed)
-----   Message from Megan Salon, MD sent at 09/08/2017 12:37 PM EDT ----- Please let pt know her pathology was negative for abnormal cells.  She did have an endometrial polyp as well that was benign.  She has follow up scheduled in October.  Thanks.

## 2017-09-24 ENCOUNTER — Other Ambulatory Visit: Payer: Self-pay | Admitting: Obstetrics & Gynecology

## 2017-09-24 MED ORDER — LEVOTHYROXINE SODIUM 125 MCG PO TABS: 125.0000 ug | ORAL_TABLET | Freq: Every day | ORAL | 3 refills | Status: DC

## 2017-09-24 NOTE — Telephone Encounter (Signed)
Medication refill request: Synthroid 125 Last AEX:  05/25/2017 Next AEX: 08/24/2018 Last MMG (if hormonal medication request): 06/11/2017 BI-RADS CATEGORY  1: Negative. Refill authorized: #90, 3 refills, please advise.

## 2017-10-16 ENCOUNTER — Inpatient Hospital Stay: Payer: BLUE CROSS/BLUE SHIELD | Attending: Family

## 2017-10-16 ENCOUNTER — Other Ambulatory Visit: Payer: Self-pay

## 2017-10-16 ENCOUNTER — Inpatient Hospital Stay (HOSPITAL_BASED_OUTPATIENT_CLINIC_OR_DEPARTMENT_OTHER): Payer: BLUE CROSS/BLUE SHIELD | Admitting: Family

## 2017-10-16 VITALS — BP 109/61 | HR 72 | Temp 98.0°F | Resp 16 | Wt 156.0 lb

## 2017-10-16 DIAGNOSIS — Z975 Presence of (intrauterine) contraceptive device: Secondary | ICD-10-CM

## 2017-10-16 DIAGNOSIS — D500 Iron deficiency anemia secondary to blood loss (chronic): Secondary | ICD-10-CM | POA: Diagnosis not present

## 2017-10-16 DIAGNOSIS — Z79899 Other long term (current) drug therapy: Secondary | ICD-10-CM | POA: Diagnosis not present

## 2017-10-16 DIAGNOSIS — N938 Other specified abnormal uterine and vaginal bleeding: Secondary | ICD-10-CM | POA: Diagnosis not present

## 2017-10-16 LAB — CBC WITH DIFFERENTIAL (CANCER CENTER ONLY)
BASOS ABS: 0 10*3/uL (ref 0.0–0.1)
BASOS PCT: 0 %
EOS ABS: 0.2 10*3/uL (ref 0.0–0.5)
Eosinophils Relative: 4 %
HCT: 40.8 % (ref 34.8–46.6)
Hemoglobin: 13.4 g/dL (ref 11.6–15.9)
Lymphocytes Relative: 34 %
Lymphs Abs: 1.9 10*3/uL (ref 0.9–3.3)
MCH: 32.1 pg (ref 26.0–34.0)
MCHC: 32.8 g/dL (ref 32.0–36.0)
MCV: 97.6 fL (ref 81.0–101.0)
Monocytes Absolute: 0.4 10*3/uL (ref 0.1–0.9)
Monocytes Relative: 8 %
Neutro Abs: 2.9 10*3/uL (ref 1.5–6.5)
Neutrophils Relative %: 54 %
Platelet Count: 264 10*3/uL (ref 145–400)
RBC: 4.18 MIL/uL (ref 3.70–5.32)
RDW: 12.9 % (ref 11.1–15.7)
WBC Count: 5.4 10*3/uL (ref 3.9–10.0)

## 2017-10-16 NOTE — Progress Notes (Signed)
Hematology and Oncology Follow Up Visit  Kathryn Gregory [id number] Oct 13, 1965 52 y.o. 10/16/2017   Principle Diagnosis:  Iron deficiency anemia secondary to chronic blood loss with heavy cycles  Current Therapy:   IV iron as indicated - last received in August 2019   Interim History: Kathryn Gregory is here today for follow-up. She is doing well but is having some pain and tingling in her knees. She has been doing cross fit and she feels that she needs better shoes.  She had an IUD placed in August and has not yet had a cycle this month.  No episodes of bleeding, no bruising or petechiae.  No fever, chills, n/v, cough, rash, dizziness, SOB, chest pain, palpitations, abdominal pain or changes in bowel or bladder habits.  No lymphadenopathy noted on exam.  She has maintained a good appetite and is eating healthier. She is staying well hydrated and her weight is stable.   ECOG Performance Status: 1 - Symptomatic but completely ambulatory  Medications:  Allergies as of 10/16/2017      Reactions   Penicillins Other (See Comments)   Childhood allergy--unknown reaction      Medication List        Accurate as of 10/16/17  2:35 PM. Always use your most recent med list.          levothyroxine 125 MCG tablet Commonly known as:  SYNTHROID, LEVOTHROID Take 1 tablet (125 mcg total) by mouth daily before breakfast.   MIRENA (52 MG) 20 MCG/24HR IUD Generic drug:  levonorgestrel 1 each by Intrauterine route once.       Allergies:  Allergies  Allergen Reactions  . Penicillins Other (See Comments)    Childhood allergy--unknown reaction    Past Medical History, Surgical history, Social history, and Family History were reviewed and updated.  Review of Systems: All other 10 point review of systems is negative.   Physical Exam:  weight is 156 lb (70.8 kg). Her oral temperature is 98 F (36.7 C). Her blood pressure is 109/61 and her pulse is 72. Her respiration is 16 and oxygen  saturation is 100%.   Wt Readings from Last 3 Encounters:  10/16/17 156 lb (70.8 kg)  09/03/17 159 lb 3.2 oz (72.2 kg)  08/17/17 162 lb (73.5 kg)    Ocular: Sclerae unicteric, pupils equal, round and reactive to light Ear-nose-throat: Oropharynx clear, dentition fair Lymphatic: No cervical, supraclavicular or axillary adenopathy Lungs no rales or rhonchi, good excursion bilaterally Heart regular rate and rhythm, no murmur appreciated Abd soft, nontender, positive bowel sounds, no liver or spleen tip palpated on exam, no fluid wave  MSK no focal spinal tenderness, no joint edema Neuro: non-focal, well-oriented, appropriate affect Breasts: Deferred   Lab Results  Component Value Date   WBC 5.4 10/16/2017   HGB 13.4 10/16/2017   HCT 40.8 10/16/2017   MCV 97.6 10/16/2017   PLT 264 10/16/2017   Lab Results  Component Value Date   FERRITIN 17 08/13/2017   IRON 38 08/13/2017   TIBC 350 08/13/2017   UIBC 312 08/13/2017   IRONPCTSAT 11 (L) 08/13/2017   Lab Results  Component Value Date   RBC 4.18 10/16/2017   No results found for: KPAFRELGTCHN, LAMBDASER, KAPLAMBRATIO No results found for: IGGSERUM, IGA, IGMSERUM No results found for: TOTALPROTELP, ALBUMINELP, A1GS, A2GS, BETS, BETA2SER, GAMS, MSPIKE, SPEI   Chemistry      Component Value Date/Time   NA 139 10/06/2016 1502   K 4.3 10/06/2016 1502   CL  104 10/06/2016 1502   CO2 27 10/06/2016 1502   BUN 14 10/06/2016 1502   CREATININE 0.59 10/06/2016 1502   CREATININE 0.64 05/08/2016 1434      Component Value Date/Time   CALCIUM 9.1 10/06/2016 1502   ALKPHOS 78 10/06/2016 1502   AST 23 10/06/2016 1502   ALT 18 10/06/2016 1502   BILITOT 0.2 10/06/2016 1502       Impression and Plan: Kathryn Gregory is a very pleasant 52 yo [place] female with iron deficiency anemia secondary to heavy cycles. She had an IUD placed a little over a month ago and she has not yet had her cycle this month.  We will see what her iron studies  show and bring her back in for infusion if needed.  We will plan to see her back in another 4 months for follow-up.  She will contact our office with any questions or concerns. We can certainly see her sooner if need be.   Laverna Peace, NP 10/4/20192:35 PM

## 2017-10-19 LAB — IRON AND TIBC
IRON: 76 ug/dL (ref 41–142)
SATURATION RATIOS: 27 % (ref 21–57)
TIBC: 281 ug/dL (ref 236–444)
UIBC: 205 ug/dL

## 2017-10-19 LAB — FERRITIN: FERRITIN: 65 ng/mL (ref 11–307)

## 2017-11-05 ENCOUNTER — Ambulatory Visit (INDEPENDENT_AMBULATORY_CARE_PROVIDER_SITE_OTHER): Payer: BLUE CROSS/BLUE SHIELD

## 2017-11-05 ENCOUNTER — Ambulatory Visit (INDEPENDENT_AMBULATORY_CARE_PROVIDER_SITE_OTHER): Payer: BLUE CROSS/BLUE SHIELD | Admitting: Obstetrics & Gynecology

## 2017-11-05 ENCOUNTER — Encounter: Payer: Self-pay | Admitting: Obstetrics & Gynecology

## 2017-11-05 VITALS — BP 102/60 | HR 76 | Resp 16 | Ht 66.0 in | Wt 155.6 lb

## 2017-11-05 DIAGNOSIS — D500 Iron deficiency anemia secondary to blood loss (chronic): Secondary | ICD-10-CM | POA: Diagnosis not present

## 2017-11-05 DIAGNOSIS — T8332XA Displacement of intrauterine contraceptive device, initial encounter: Secondary | ICD-10-CM | POA: Diagnosis not present

## 2017-11-05 DIAGNOSIS — N921 Excessive and frequent menstruation with irregular cycle: Secondary | ICD-10-CM | POA: Diagnosis not present

## 2017-11-05 NOTE — Progress Notes (Signed)
GYNECOLOGY  VISIT  CC:   IUD recheck, h/o menorrhagia with anemia  HPI:  52 y.o. G66P1102 Married Hispanic female here for IUD recheck.  Reports bleeding lasted about a month after IUD placement.  It was never heavy but persistent.  Bleeding has stopped the last three weeks.  She has seen hematology and has received two iron transfusions.  Is feeling better.  Most recent hemoglobin was significantly improved and was 13.4.    Denies pelvic pain.  GYNECOLOGIC HISTORY: No LMP recorded. Contraception: BTL  Patient Active Problem List   Diagnosis Date Noted  . Iron deficiency anemia due to chronic blood loss 10/07/2016  . Fungal infection of toenail 10/18/2015  . Gastric mass s/p excision 08/02/15 08/03/2015  . Ventral hernia 05/01/2014  . Routine general medical examination at a health care facility 05/01/2014  . Postablative hypothyroidism 09/06/2009  . ANEMIA-NOS 05/01/2009  . HEARING LOSS 05/01/2009  . GERD 05/01/2009  . ROSACEA 05/01/2009  . LOW BACK PAIN, CHRONIC 05/01/2009    Past Medical History:  Diagnosis Date  . ANEMIA-NOS   . GERD   . IBS (irritable bowel syndrome)   . Lumbar herniated disc    not a problem now  . Other postablative hypothyroidism 07/2009   I-131ablation 07/2009 for hyperthyroid  . Rosacea     Past Surgical History:  Procedure Laterality Date  . EUS N/A 05/04/2015   Procedure: UPPER ENDOSCOPIC ULTRASOUND (EUS) LINEAR;  Surgeon: Carol Ada, MD;  Location: [facility];  Service: Endoscopy;  Laterality: N/A;  . EUS N/A 06/15/2015   Procedure: UPPER ENDOSCOPIC ULTRASOUND (EUS) LINEAR;  Surgeon: Carol Ada, MD;  Location: [facility];  Service: Endoscopy;  Laterality: N/A;  . LAPAROSCOPIC GASTRIC RESECTION N/A 08/03/2015   Procedure: LAPAROSCOPIC GASTROTOMY WITH EXCISION OF  GASTIC TUMOR;  Surgeon: Jackolyn Confer, MD;  Location: [facility];  Service: General;  Laterality: N/A;  . TONSILLECTOMY    . TUBAL LIGATION  1998    MEDS:   Current Outpatient  Medications on File Prior to Visit  Medication Sig Dispense Refill  . levonorgestrel (MIRENA, 52 MG,) 20 MCG/24HR IUD 1 each by Intrauterine route once.    [name] levothyroxine (SYNTHROID) 125 MCG tablet Take 1 tablet (125 mcg total) by mouth daily before breakfast. 90 tablet 3   No current facility-administered medications on file prior to visit.     ALLERGIES: Penicillins  Family History  Problem Relation Age of Onset  . Mental illness Mother   . Lung disease Mother        lung fibrosis  . Breast cancer Maternal Aunt   . Diabetes Father 49  . Colon cancer Paternal Uncle   . Dementia Maternal Grandmother   . Breast cancer Cousin 3  . Cancer Other        cancer of tongue  . Multiple births Sister        3 sets of twins  . Cancer Maternal Uncle        unsure type  . Diabetes Paternal Grandmother     SH:  Married, non smoker  Review of Systems  All other systems reviewed and are negative.   PHYSICAL EXAMINATION:    BP 102/60 (BP Location: Right Arm, Patient Position: Sitting, Cuff Size: Normal)   Pulse 76   Resp 16   Ht 5\' 6"  (1.676 m)   Wt 155 lb 9.6 oz (70.6 kg)   BMI 25.11 kg/m     General appearance: alert, cooperative and appears stated age Abdomen:  soft, non-tender; bowel sounds normal; no masses,  no organomegaly Lymph:  no inguinal LAD noted  Pelvic: External genitalia:  no lesions              Urethra:  normal appearing urethra with no masses, tenderness or lesions              Bartholins and Skenes: normal                 Vagina: normal appearing vagina with normal color and discharge, no lesions              Cervix: no lesions and IUD string is very long, c/w malpositioned IUD              Bimanual Exam:  Uterus:  normal size, contour, position, consistency, mobility, non-tender              Adnexa: no mass, fullness, tenderness              Chaperone was present for exam.  Findings reviewed with pt.  PUS recommended.  This could be done today.  Uterus:   6.8 x 4.3 x 4.5cm, IUD noted in cervical canal with arm noted in LUS Endometrium:  3.28mm Left ovary:  1.8 x 2.0 x 0.9cm Right ovary:  1.6 x 1.5 x 1.2cm Cul de sac:  No free fluid  Recommended IUD removal.  She desires replacement today as well.  Consents obtained.  Desires to continue using Mirena IUD.  Recent STD testing:  Not obtained as in monogamous relationship   Procedure:  Speculum reinserted.  Cervix visualized and cleansed with Betadine x 3.  Paracervical block was not placed.  Single toothed tenaculum applied to anterior lip of cervix without difficulty.    IUD string noted and grasped with ringed forcep.  With one pull, IUD removed easily.  Pt has some mild cramping but tolerated this well.  Then uterus sounded to 7cm.  Lot number: [id number].  Expiration:  01/2020.  IUD package was opened.  IUD and introducer passed to fundus and then withdrawn slightly before IUD was passed into endometrial cavity.  Introducer removed.  Strings cut to 2cm.  Tenaculum removed from cervix.  Minimal bleeding noted.  Pt tolerated the procedure well.  All instruments removed from vagina.  A: Removal of Mirena IUD due to malposition and location in cervix and reinsertion of Mirena IUD Menorrhagia  P:  Return for recheck 6-8 weeks Pt aware removal due no later than 11/06/2022.  IUD card given to pt.

## 2017-12-08 ENCOUNTER — Ambulatory Visit: Payer: BLUE CROSS/BLUE SHIELD | Admitting: Obstetrics & Gynecology

## 2017-12-08 ENCOUNTER — Other Ambulatory Visit: Payer: Self-pay

## 2017-12-08 ENCOUNTER — Ambulatory Visit (INDEPENDENT_AMBULATORY_CARE_PROVIDER_SITE_OTHER): Payer: BLUE CROSS/BLUE SHIELD | Admitting: Obstetrics & Gynecology

## 2017-12-08 ENCOUNTER — Encounter: Payer: Self-pay | Admitting: Obstetrics & Gynecology

## 2017-12-08 VITALS — BP 112/70 | HR 80 | Resp 16 | Ht 66.0 in | Wt 153.6 lb

## 2017-12-08 DIAGNOSIS — F4321 Adjustment disorder with depressed mood: Secondary | ICD-10-CM | POA: Diagnosis not present

## 2017-12-08 DIAGNOSIS — Z30431 Encounter for routine checking of intrauterine contraceptive device: Secondary | ICD-10-CM | POA: Diagnosis not present

## 2017-12-08 NOTE — Progress Notes (Signed)
GYNECOLOGY  VISIT  CC:   IUD recheck  HPI: 52 y.o. G36P1102 Married female here for [place] IUD check placed 11/05/17.  Has not had any further bleeding since that time except for spotting once after intercourse.  Reports her son died two weeks ago.  He was 30.  He was abusing Mucinex.  This has been an ongoing problem.  He has an 69 year old son that she has helped to care for over the past many years.  Biological mother still has custody.  Pt has not done any cleaning out of his place at this time.  Dreading doing this.  Very tearful, of course.  Didn't take any time from work.  Feels being at work has helped.  Was suggested last year to do an intervention.  Felt she could not.  Now feeling very guilty--should she have/would situation be different now.  Wants suggestions for counseling for both her and grandson.  Information provided regarding hospice services and [facility] for grandson.  Support given.  GYNECOLOGIC HISTORY: No LMP recorded. Contraception: BTL Menopausal hormone therapy: none  Patient Active Problem List   Diagnosis Date Noted  . Iron deficiency anemia due to chronic blood loss 10/07/2016  . Fungal infection of toenail 10/18/2015  . Gastric mass s/p excision 08/02/15 08/03/2015  . Ventral hernia 05/01/2014  . Routine general medical examination at a health care facility 05/01/2014  . Postablative hypothyroidism 09/06/2009  . ANEMIA-NOS 05/01/2009  . HEARING LOSS 05/01/2009  . GERD 05/01/2009  . ROSACEA 05/01/2009  . LOW BACK PAIN, CHRONIC 05/01/2009    Past Medical History:  Diagnosis Date  . ANEMIA-NOS   . GERD   . IBS (irritable bowel syndrome)   . Lumbar herniated disc    not a problem now  . Other postablative hypothyroidism 07/2009   I-131ablation 07/2009 for hyperthyroid  . Rosacea     Past Surgical History:  Procedure Laterality Date  . EUS N/A 05/04/2015   Procedure: UPPER ENDOSCOPIC ULTRASOUND (EUS) LINEAR;  Surgeon: Carol Ada, MD;  Location: [facility];  Service: Endoscopy;  Laterality: N/A;  . EUS N/A 06/15/2015   Procedure: UPPER ENDOSCOPIC ULTRASOUND (EUS) LINEAR;  Surgeon: Carol Ada, MD;  Location: [facility];  Service: Endoscopy;  Laterality: N/A;  . LAPAROSCOPIC GASTRIC RESECTION N/A 08/03/2015   Procedure: LAPAROSCOPIC GASTROTOMY WITH EXCISION OF  GASTIC TUMOR;  Surgeon: Jackolyn Confer, MD;  Location: [facility];  Service: General;  Laterality: N/A;  . TONSILLECTOMY    . TUBAL LIGATION  1998    MEDS:   Current Outpatient Medications on File Prior to Visit  Medication Sig Dispense Refill  . levonorgestrel (MIRENA, 52 MG,) 20 MCG/24HR IUD 1 each by Intrauterine route once. Placed 11/05/17    . levothyroxine (SYNTHROID) 125 MCG tablet Take 1 tablet (125 mcg total) by mouth daily before breakfast. 90 tablet 3   No current facility-administered medications on file prior to visit.     ALLERGIES: Penicillins  Family History  Problem Relation Age of Onset  . Mental illness Mother   . Lung disease Mother        lung fibrosis  . Breast cancer Maternal Aunt   . Diabetes Father 64  . Colon cancer Paternal Uncle   . Dementia Maternal Grandmother   . Breast cancer Cousin 40  . Cancer Other        cancer of tongue  . Multiple births Sister        3 sets of twins  . Cancer  Maternal Uncle        unsure type  . Diabetes Paternal Grandmother     SH:  Married, non smoker  Review of Systems  All other systems reviewed and are negative.   PHYSICAL EXAMINATION:    BP 112/70 (BP Location: Right Arm, Patient Position: Sitting, Cuff Size: Normal)   Pulse 80   Resp 16   Ht 5\' 6"  (1.676 m)   Wt 153 lb 9.6 oz (69.7 kg)   BMI 24.79 kg/m     General appearance: alert, cooperative and appears stated age Abdomen: soft, non-tender; bowel sounds normal; no masses,  no organomegaly Lymph:  no inguinal LAD noted  Pelvic: External genitalia:  no lesions              Urethra:  normal appearing urethra with no masses, tenderness  or lesions              Bartholins and Skenes: normal                 Vagina: normal appearing vagina with normal color and discharge, no lesions              Cervix: no lesions, IUD string noted              Bimanual Exam:  Uterus:  normal size, contour, position, consistency, mobility, non-tender              Adnexa: no mass, fullness, tenderness  Chaperone was present for exam.  Assessment: H/o menorrhagia and iron deficiency anemia Recent death of son  Plan: Pt will return next year for AEX as scheduled.  Information about hospice services provided to pt.  She is appreciative of this.   ~20 minutes spent with patient >50% of time was in face to face discussion of above.

## 2017-12-29 DIAGNOSIS — M7711 Lateral epicondylitis, right elbow: Secondary | ICD-10-CM | POA: Diagnosis not present

## 2017-12-29 DIAGNOSIS — M25551 Pain in right hip: Secondary | ICD-10-CM | POA: Diagnosis not present

## 2017-12-29 DIAGNOSIS — M25521 Pain in right elbow: Secondary | ICD-10-CM | POA: Diagnosis not present

## 2018-08-24 ENCOUNTER — Ambulatory Visit: Payer: BLUE CROSS/BLUE SHIELD | Admitting: Obstetrics & Gynecology

## 2018-08-24 ENCOUNTER — Encounter

## 2018-08-31 DIAGNOSIS — H8023 Cochlear otosclerosis, bilateral: Secondary | ICD-10-CM | POA: Diagnosis not present

## 2018-08-31 DIAGNOSIS — H900 Conductive hearing loss, bilateral: Secondary | ICD-10-CM | POA: Diagnosis not present

## 2018-11-05 NOTE — Progress Notes (Signed)
53 y.o. G16P1102 Married Other or two or more races female here for annual exam.  Reports she's having some right mid abdominal quadrant pain that has been going on "for a long time".  Pain does not have any relation to eating.  She reports it does hurt worse when she is doing piliates.  Certain movements seen to exacerbate the pain.    Denies vaginal bleeding.  Had IUD placed 11/06/2018.  Bleeding stopped before the end of the year.    She and her husband have grandson three or four days a week.  Son died last year.  Reports she and mother do not always see eye to eye on their grandson.  LMP: none         Sexually active: Yes.    The current method of family planning is IUD.    Exercising: Yes.    [place]  Smoker:  no  Health Maintenance: Pap: 05/25/17 Normal         01/25/15 Neg. HR HPV:neg  History of abnormal Pap:  no MMG:  06/11/17 Density C Bi-rads 1 neg  Colonoscopy:  02/23/15 Polyps. F/u 5 years  BMD:   Never TDaP:  2006 Pneumonia vaccine(s):  NA Shingrix:   No Flu shot:  Would like this today Hep C testing: NA Screening Labs: If needed    reports that she has quit smoking. She quit after 10.00 years of use. She has never used smokeless tobacco. She reports current alcohol use of about 1.0 standard drinks of alcohol per week. She reports that she does not use drugs.  Past Medical History:  Diagnosis Date  . ANEMIA-NOS   . GERD   . IBS (irritable bowel syndrome)   . Lumbar herniated disc    not a problem now  . Other postablative hypothyroidism 07/2009   I-131ablation 07/2009 for hyperthyroid  . Rosacea     Past Surgical History:  Procedure Laterality Date  . EUS N/A 05/04/2015   Procedure: UPPER ENDOSCOPIC ULTRASOUND (EUS) LINEAR;  Surgeon: Carol Ada, MD;  Location: [facility];  Service: Endoscopy;  Laterality: N/A;  . EUS N/A 06/15/2015   Procedure: UPPER ENDOSCOPIC ULTRASOUND (EUS) LINEAR;  Surgeon: Carol Ada, MD;  Location: [facility];  Service: Endoscopy;   Laterality: N/A;  . LAPAROSCOPIC GASTRIC RESECTION N/A 08/03/2015   Procedure: LAPAROSCOPIC GASTROTOMY WITH EXCISION OF  GASTIC TUMOR;  Surgeon: Jackolyn Confer, MD;  Location: [facility];  Service: General;  Laterality: N/A;  . TONSILLECTOMY    . TUBAL LIGATION  1998    Current Outpatient Medications  Medication Sig Dispense Refill  . levonorgestrel (MIRENA) 20 MCG/24HR IUD 1 each by Intrauterine route once.    [name] levothyroxine (SYNTHROID) 125 MCG tablet Take 1 tablet (125 mcg total) by mouth daily before breakfast. 90 tablet 3  . magnesium 30 MG tablet Take 30 mg by mouth 2 (two) times daily.     No current facility-administered medications for this visit.     Family History  Problem Relation Age of Onset  . Mental illness Mother   . Lung disease Mother        lung fibrosis  . Breast cancer Maternal Aunt   . Diabetes Father 9  . Colon cancer Paternal Uncle   . Dementia Maternal Grandmother   . Breast cancer Cousin 77  . Cancer Other        cancer of tongue  . Multiple births Sister        3 sets of twins  .  Cancer Maternal Uncle        unsure type  . Diabetes Paternal Grandmother     Review of Systems  All other systems reviewed and are negative.   Exam:   BP 118/62   Pulse 67   Temp (!) 97.2 F (36.2 C)   Ht 5' 6.5" (1.689 m)   Wt 171 lb (77.6 kg)   SpO2 99%   BMI 27.19 kg/m   Height: 5' 6.5" (168.9 cm)  Ht Readings from Last 3 Encounters:  11/08/18 5' 6.5" (1.689 m)  12/08/17 5\' 6"  (1.676 m)  11/05/17 5\' 6"  (1.676 m)    General appearance: alert, cooperative and appears stated age Head: Normocephalic, without obvious abnormality, atraumatic Neck: no adenopathy, supple, symmetrical, trachea midline and thyroid normal to inspection and palpation Lungs: clear to auscultation bilaterally Breasts: normal appearance, no masses or tenderness Heart: regular rate and rhythm Abdomen: soft, non-tender; bowel sounds normal; no masses,  no organomegaly Extremities:  extremities normal, atraumatic, no cyanosis or edema Skin: Skin color, texture, turgor normal. No rashes or lesions Lymph nodes: Cervical, supraclavicular, and axillary nodes normal. No abnormal inguinal nodes palpated Neurologic: Grossly normal   Pelvic: External genitalia:  no lesions              Urethra:  normal appearing urethra with no masses, tenderness or lesions              Bartholins and Skenes: normal                 Vagina: normal appearing vagina with normal color and discharge, no lesions              Cervix: no lesions, IUD string noted              Pap taken: No. Bimanual Exam:  Uterus:  normal size, contour, position, consistency, mobility, non-tender              Adnexa: normal adnexa and no mass, fullness, tenderness               Rectovaginal: DECLINES               Anus: no visible lesions  Chaperone was present for exam.  A:  Well Woman with normal exam H/o menorrhagia and iron deficiency anemia, s/p iron transfusions x 2 IBS Hypothryoidism Family hx of breast cancer in maternal aunt Amenorrhea after Mirena placement 11/06/2018  P:   Mammogram guidelines reviewed.  Aware this is over. Pap smear neg with neg HR HPV 2017.  Neg pap 2019.  Not indicated today.  TSH and iron studies obtained RF for synthroid 125MCG daily.  #90/4RF. Colonoscopy is due 2022 Declines Tdap.  Aware to call if changes mind.  She is aware overdue and to call with any environmental exposure.   Return annually or prn

## 2018-11-08 ENCOUNTER — Encounter: Payer: Self-pay | Admitting: Obstetrics & Gynecology

## 2018-11-08 ENCOUNTER — Other Ambulatory Visit: Payer: Self-pay

## 2018-11-08 ENCOUNTER — Ambulatory Visit (INDEPENDENT_AMBULATORY_CARE_PROVIDER_SITE_OTHER): Payer: BC Managed Care – PPO | Admitting: Obstetrics & Gynecology

## 2018-11-08 VITALS — BP 118/62 | HR 67 | Temp 97.2°F | Ht 66.5 in | Wt 171.0 lb

## 2018-11-08 DIAGNOSIS — Z0000 Encounter for general adult medical examination without abnormal findings: Secondary | ICD-10-CM | POA: Diagnosis not present

## 2018-11-08 DIAGNOSIS — Z01419 Encounter for gynecological examination (general) (routine) without abnormal findings: Secondary | ICD-10-CM | POA: Diagnosis not present

## 2018-11-08 MED ORDER — LEVOTHYROXINE SODIUM 125 MCG PO TABS: 125.0000 ug | ORAL_TABLET | Freq: Every day | ORAL | 3 refills | Status: DC

## 2018-11-09 LAB — TSH: TSH: 0.894 u[IU]/mL (ref 0.450–4.500)

## 2018-11-09 LAB — IRON,TIBC AND FERRITIN PANEL
Ferritin: 86 ng/mL (ref 15–150)
Iron Saturation: 24 % (ref 15–55)
Iron: 69 ug/dL (ref 27–159)
Total Iron Binding Capacity: 282 ug/dL (ref 250–450)
UIBC: 213 ug/dL (ref 131–425)

## 2018-11-12 LAB — T4, FREE: Free T4: 1.24 ng/dL (ref 0.82–1.77)

## 2018-11-12 LAB — SPECIMEN STATUS REPORT

## 2019-01-20 DIAGNOSIS — M25551 Pain in right hip: Secondary | ICD-10-CM | POA: Diagnosis not present

## 2019-01-28 DIAGNOSIS — M25551 Pain in right hip: Secondary | ICD-10-CM | POA: Diagnosis not present

## 2019-02-02 DIAGNOSIS — M7061 Trochanteric bursitis, right hip: Secondary | ICD-10-CM | POA: Diagnosis not present

## 2019-02-02 DIAGNOSIS — M25551 Pain in right hip: Secondary | ICD-10-CM | POA: Diagnosis not present

## 2019-02-02 DIAGNOSIS — M1611 Unilateral primary osteoarthritis, right hip: Secondary | ICD-10-CM | POA: Diagnosis not present

## 2019-02-03 DIAGNOSIS — M7061 Trochanteric bursitis, right hip: Secondary | ICD-10-CM | POA: Diagnosis not present

## 2019-02-03 DIAGNOSIS — M1611 Unilateral primary osteoarthritis, right hip: Secondary | ICD-10-CM | POA: Diagnosis not present

## 2019-02-08 DIAGNOSIS — M7061 Trochanteric bursitis, right hip: Secondary | ICD-10-CM | POA: Diagnosis not present

## 2019-02-08 DIAGNOSIS — M1611 Unilateral primary osteoarthritis, right hip: Secondary | ICD-10-CM | POA: Diagnosis not present

## 2019-02-11 DIAGNOSIS — M1611 Unilateral primary osteoarthritis, right hip: Secondary | ICD-10-CM | POA: Diagnosis not present

## 2019-02-11 DIAGNOSIS — M7061 Trochanteric bursitis, right hip: Secondary | ICD-10-CM | POA: Diagnosis not present

## 2019-02-15 DIAGNOSIS — M7061 Trochanteric bursitis, right hip: Secondary | ICD-10-CM | POA: Diagnosis not present

## 2019-02-15 DIAGNOSIS — M1611 Unilateral primary osteoarthritis, right hip: Secondary | ICD-10-CM | POA: Diagnosis not present

## 2019-02-17 DIAGNOSIS — M7061 Trochanteric bursitis, right hip: Secondary | ICD-10-CM | POA: Diagnosis not present

## 2019-02-17 DIAGNOSIS — M1611 Unilateral primary osteoarthritis, right hip: Secondary | ICD-10-CM | POA: Diagnosis not present

## 2019-02-21 DIAGNOSIS — M7061 Trochanteric bursitis, right hip: Secondary | ICD-10-CM | POA: Diagnosis not present

## 2019-02-21 DIAGNOSIS — M1611 Unilateral primary osteoarthritis, right hip: Secondary | ICD-10-CM | POA: Diagnosis not present

## 2019-02-23 DIAGNOSIS — M7061 Trochanteric bursitis, right hip: Secondary | ICD-10-CM | POA: Diagnosis not present

## 2019-02-23 DIAGNOSIS — M1611 Unilateral primary osteoarthritis, right hip: Secondary | ICD-10-CM | POA: Diagnosis not present

## 2019-02-28 DIAGNOSIS — M1611 Unilateral primary osteoarthritis, right hip: Secondary | ICD-10-CM | POA: Diagnosis not present

## 2019-02-28 DIAGNOSIS — M7061 Trochanteric bursitis, right hip: Secondary | ICD-10-CM | POA: Diagnosis not present

## 2019-03-02 DIAGNOSIS — M1611 Unilateral primary osteoarthritis, right hip: Secondary | ICD-10-CM | POA: Diagnosis not present

## 2019-03-02 DIAGNOSIS — M7061 Trochanteric bursitis, right hip: Secondary | ICD-10-CM | POA: Diagnosis not present

## 2019-03-20 ENCOUNTER — Encounter: Payer: Self-pay | Admitting: Obstetrics & Gynecology

## 2019-05-04 DIAGNOSIS — H906 Mixed conductive and sensorineural hearing loss, bilateral: Secondary | ICD-10-CM | POA: Diagnosis not present

## 2019-05-04 DIAGNOSIS — H8003 Otosclerosis involving oval window, nonobliterative, bilateral: Secondary | ICD-10-CM | POA: Diagnosis not present

## 2019-05-05 ENCOUNTER — Other Ambulatory Visit: Payer: Self-pay

## 2019-05-06 ENCOUNTER — Encounter: Payer: Self-pay | Admitting: Family Medicine

## 2019-05-06 ENCOUNTER — Ambulatory Visit (INDEPENDENT_AMBULATORY_CARE_PROVIDER_SITE_OTHER): Payer: BC Managed Care – PPO | Admitting: Family Medicine

## 2019-05-06 VITALS — BP 112/76 | HR 84 | Temp 97.9°F | Resp 12 | Ht 66.5 in | Wt 172.0 lb

## 2019-05-06 DIAGNOSIS — Z01818 Encounter for other preprocedural examination: Secondary | ICD-10-CM | POA: Diagnosis not present

## 2019-05-06 DIAGNOSIS — M7712 Lateral epicondylitis, left elbow: Secondary | ICD-10-CM | POA: Diagnosis not present

## 2019-05-06 LAB — COMPREHENSIVE METABOLIC PANEL
ALT: 21 U/L (ref 0–35)
AST: 24 U/L (ref 0–37)
Albumin: 4.3 g/dL (ref 3.5–5.2)
Alkaline Phosphatase: 81 U/L (ref 39–117)
BUN: 14 mg/dL (ref 6–23)
CO2: 31 mEq/L (ref 19–32)
Calcium: 9 mg/dL (ref 8.4–10.5)
Chloride: 105 mEq/L (ref 96–112)
Creatinine, Ser: 0.75 mg/dL (ref 0.40–1.20)
GFR: 80.39 mL/min (ref >60.00)
Glucose, Bld: 89 mg/dL (ref 70–99)
Potassium: 4.4 mEq/L (ref 3.5–5.1)
Sodium: 141 mEq/L (ref 135–145)
Total Bilirubin: 0.8 mg/dL (ref 0.2–1.2)
Total Protein: 6.5 g/dL (ref 6.0–8.3)

## 2019-05-06 LAB — CBC
HCT: 34.9 % — ABNORMAL LOW (ref 36.0–46.0)
Hemoglobin: 11.9 g/dL — ABNORMAL LOW (ref 12.0–15.0)
MCHC: 34.1 g/dL (ref 30.0–36.0)
MCV: 111.3 fl — ABNORMAL HIGH (ref 78.0–100.0)
Platelets: 315 10*3/uL (ref 150.0–400.0)
RBC: 3.14 Mil/uL — ABNORMAL LOW (ref 3.87–5.11)
RDW: 15 % (ref 11.5–15.5)
WBC: 4.5 10*3/uL (ref 4.0–10.5)

## 2019-05-06 NOTE — Progress Notes (Addendum)
HPI:   Kathryn Gregory is a 54 y.o. female, who is here today to establish care.  Former PCP: Dr Kathryn Gregory Last preventive routine visit: 04/2014.  Routine gyn exam on 11/08/18.  Chronic medical problems: Congenital facial palsy (right side),postablative hypothyroidism,GERD,and back pain among some.  Concerns today: She is planning on having plastic surgery and needs a pre-op evaluation requested by Dr. Payton Mccallum..  Bilateral oculo plastic surgery (eyelid/fascia surgery) in [place] on 05/24/19. She needs to have pre op labs done and EKG. No Hx of tobacco use,HTN,HLD,or CAD.  In 2016 she underwent colon polypectomy under general anesthesia,denies any complication during or right after procedure. Negative for CP,SOB,palpitations,diaphoreis,or dizziness with climbing a flight of stairs,carrying heavy groceries,or walking a hill.  Left elbow pain for about 3 months. Stable. Exacerbated by certain movement. She has not noted erythema or edema. No limitation of ROM. She has not tried OTC med.  Review of Systems  Constitutional: Negative for activity change, appetite change, fatigue and fever.  HENT: Negative for mouth sores, nosebleeds and sore throat.   Eyes: Negative for redness and visual disturbance.  Respiratory: Negative for cough and wheezing.   Cardiovascular: Negative for leg swelling.  Gastrointestinal: Negative for abdominal pain, nausea and vomiting.       Negative for changes in bowel habits.  Genitourinary: Negative for decreased urine volume, difficulty urinating, dysuria and hematuria.  Musculoskeletal: Negative for gait problem and myalgias.  Skin: Negative for pallor and rash.  Neurological: Negative for syncope, weakness and headaches.  Rest see pertinent positives and negatives per HPI.  Current Outpatient Medications on File Prior to Visit  Medication Sig Dispense Refill  . levonorgestrel (MIRENA) 20 MCG/24HR IUD 1 each by Intrauterine route once.    Kathryn Gregory  levothyroxine (SYNTHROID) 125 MCG tablet Take 1 tablet (125 mcg total) by mouth daily before breakfast. 90 tablet 3  . magnesium 30 MG tablet Take 30 mg by mouth. 3 times a month     No current facility-administered medications on file prior to visit.   Past Medical History:  Diagnosis Date  . ANEMIA-NOS   . GERD   . IBS (irritable bowel syndrome)   . Lumbar herniated disc    not a problem now  . Other postablative hypothyroidism 07/2009   I-131ablation 07/2009 for hyperthyroid  . Rosacea    Allergies  Allergen Reactions  . Penicillins Other (See Comments)    Childhood allergy--unknown reaction    Family History  Problem Relation Age of Onset  . Mental illness Mother   . Lung disease Mother        lung fibrosis  . Breast cancer Maternal Aunt   . Diabetes Father 69  . Colon cancer Paternal Uncle   . Dementia Maternal Grandmother   . Breast cancer Cousin 24  . Cancer Other        cancer of tongue  . Multiple births Sister        3 sets of twins  . Cancer Maternal Uncle        unsure type  . Diabetes Paternal Grandmother     Social History   Socioeconomic History  . Marital status: Married    Spouse name: Not on file  . Number of children: 2  . Years of education: Not on file  . Highest education level: Associate degree: academic program  Occupational History  . Not on file  Tobacco Use  . Smoking status: Former Smoker    Years: 10.00  .  Smokeless tobacco: Never Used  . Tobacco comment: Married, lives with spouse since 2010. 2 grown kids in [place] in 2009 from [place] to learn english  Substance and Sexual Activity  . Alcohol use: Yes    Alcohol/week: 1.0 standard drinks    Types: 1 Glasses of wine per week    Comment: social  . Drug use: No  . Sexual activity: Yes    Partners: Male    Birth control/protection: I.U.D.    Comment: BTL  Other Topics Concern  . Not on file  Social History Narrative  . Not on file   Social Determinants of Health    Financial Resource Strain:   . Difficulty of Paying Living Expenses:   Food Insecurity:   . Worried About Charity fundraiser in the Last Year:   . Arboriculturist in the Last Year:   Transportation Needs:   . Film/video editor (Medical):   Kathryn Gregory Lack of Transportation (Non-Medical):   Physical Activity:   . Days of Exercise per Week:   . Minutes of Exercise per Session:   Stress:   . Feeling of Stress :   Social Connections:   . Frequency of Communication with Friends and Family:   . Frequency of Social Gatherings with Friends and Family:   . Attends Religious Services:   . Active Member of Clubs or Organizations:   . Attends Archivist Meetings:   Kathryn Gregory Marital Status:     Vitals:   05/06/19 0906  BP: 112/76  Pulse: 84  Resp: 12  Temp: 97.9 F (36.6 C)  SpO2: 99%    Body mass index is 27.35 kg/m.  Physical Exam  Nursing note and vitals reviewed. Constitutional: She is oriented to person, place, and time. She appears well-developed. No distress.  HENT:  Head: Normocephalic and atraumatic.  Mouth/Throat: Oropharynx is clear and moist and mucous membranes are normal.  Eyes: Pupils are equal, round, and reactive to light. Conjunctivae are normal.  Right pinguecula and left pterygium.  Cardiovascular: Normal rate and regular rhythm.  No murmur heard. Pulses:      Dorsalis pedis pulses are 2+ on the right side and 2+ on the left side.  Respiratory: Effort normal and breath sounds normal. No respiratory distress.  GI: Soft. She exhibits no mass. There is no hepatomegaly. There is no abdominal tenderness.  Musculoskeletal:        General: No edema.  Lymphadenopathy:    She has no cervical adenopathy.  Neurological: She is alert and oriented to person, place, and time. She has normal strength. Gait normal.  Right facial palsy, rest of CN grossly intact.  Skin: Skin is warm. No rash noted. No erythema.  Psychiatric: She has a normal mood and affect.  Well  groomed, good eye contact.    ASSESSMENT AND PLAN:  [name] was seen today for establish care.  Diagnoses and all orders for this visit:  Lab Results  Component Value Date   WBC 4.5 05/06/2019   HGB 11.9 (L) 05/06/2019   HCT 34.9 (L) 05/06/2019   MCV 111.3 Repeated and verified X2. (H) 05/06/2019   PLT 315.0 05/06/2019   Lab Results  Component Value Date   CREATININE 0.75 05/06/2019   BUN 14 05/06/2019   NA 141 05/06/2019   K 4.4 05/06/2019   CL 105 05/06/2019   CO2 31 05/06/2019   Lab Results  Component Value Date   ALT 21 05/06/2019   AST 24 05/06/2019  ALKPHOS 81 05/06/2019   BILITOT 0.8 05/06/2019    Preop general physical exam She feels like surgery will improve quality of life,so would like to precede. Class I risk, low risk of CV complications (revised cardiac risk index). Grade 1 risk using Goldman cardiac risk. Stop OTC supplements,avoid NSAID's,Aspirin at least 7 days before surgery.  I am not anticipating serious lab abnormalities. EKG today SR,normal axis,and intervals. No significant changes when compared with in 06/2013 and 06/2011, except for mildly lower voltage DIII. Cleared for surgery. Form will be completed and copy of note will be faxed to surgeon.   -     EKG 12-Lead -     CBC -     Comprehensive metabolic panel  Lateral epicondylitis of left elbow We discussed dx,prognosis,and treatment options. For now she prefers to hold on PT and steroid local injection. She will let me know if she wants to see ortho and start PT.    Return in about 1 year (around 05/05/2020).  Maggie Dworkin G. [place], MD  [facility]. [place] office.

## 2019-05-06 NOTE — Patient Instructions (Addendum)
A few things to remember from today's visit:   Preop general physical exam - Plan: EKG 12-Lead, CBC, Comprehensive metabolic panel  Lateral epicondylitis of left elbow  We will fax note to surgeon and form after completion.  Local ice, lidocaine patch may help. Local steroid injection if not better and PT.     Tennis Elbow Tennis elbow is swelling (inflammation) in your outer forearm, near your elbow. Swelling affects the tissues that connect muscle to bone (tendons). Tennis elbow can happen in any sport or job in which you use your elbow too much. It is caused by doing the same motion over and over. Tennis elbow can cause:  Pain and tenderness in your forearm and the outer part of your elbow. You may have pain all the time, or only when using the arm.  A burning feeling. This runs from your elbow through your arm.  Weak grip in your hand. Follow these instructions at home: Activity  Rest your elbow and wrist. Avoid activities that cause problems, as told by your doctor.  If told by your doctor, wear an elbow strap to reduce stress on the area.  Do physical therapy exercises as told.  If you lift an object, lift it with your palm facing up. This is easier on your elbow. Lifestyle  If your tennis elbow is caused by sports, check your equipment and make sure that: ? You are using it correctly. ? It fits you well.  If your tennis elbow is caused by work or by using a computer, take breaks often to stretch your arm. Talk with your manager about how you can manage your condition at work. If you have a brace:  Wear the brace as told by your doctor. Remove it only as told by your doctor.  Loosen the brace if your fingers tingle, get numb, or turn cold and blue.  Keep the brace clean.  If the brace is not waterproof, ask your doctor if you may take the brace off for bathing. If you must keep the brace on while bathing: ? Do not let it get wet. ? Cover it with a watertight  covering when you take a bath or a shower. General instructions   If told, put ice on the painful area: ? Put ice in a plastic bag. ? Place a towel between your skin and the bag. ? Leave the ice on for 20 minutes, 2-3 times a day.  Take over-the-counter and prescription medicines only as told by your doctor.  Keep all follow-up visits as told by your doctor. This is important. Contact a doctor if:  Your pain does not get better with treatment.  Your pain gets worse.  You have weakness in your forearm, hand, or fingers.  You cannot feel your forearm, hand, or fingers. Summary  Tennis elbow is swelling (inflammation) in your outer forearm, near your elbow.  Tennis elbow is caused by doing the same motion over and over.  Rest your elbow and wrist. Avoid activities that cause problems, as told by your doctor.  If told, put ice on the painful area for 20 minutes, 2-3 times a day. This information is not intended to replace advice given to you by your health care provider. Make sure you discuss any questions you have with your health care provider. Document Revised: 09/25/2017 Document Reviewed: 10/14/2016 Elsevier Patient Education  [place].

## 2019-05-07 ENCOUNTER — Encounter: Payer: Self-pay | Admitting: Family Medicine

## 2019-05-11 ENCOUNTER — Ambulatory Visit: Payer: BLUE CROSS/BLUE SHIELD | Admitting: Family Medicine

## 2019-07-01 ENCOUNTER — Encounter: Payer: Self-pay | Admitting: Family Medicine

## 2019-07-05 ENCOUNTER — Other Ambulatory Visit: Payer: Self-pay

## 2019-07-06 ENCOUNTER — Encounter: Payer: Self-pay | Admitting: Family Medicine

## 2019-07-06 ENCOUNTER — Ambulatory Visit (INDEPENDENT_AMBULATORY_CARE_PROVIDER_SITE_OTHER): Payer: BC Managed Care – PPO | Admitting: Family Medicine

## 2019-07-06 VITALS — BP 118/78 | HR 84 | Temp 97.1°F | Resp 12 | Ht 66.5 in | Wt 165.5 lb

## 2019-07-06 DIAGNOSIS — K140 Glossitis: Secondary | ICD-10-CM | POA: Diagnosis not present

## 2019-07-06 DIAGNOSIS — E890 Postprocedural hypothyroidism: Secondary | ICD-10-CM

## 2019-07-06 DIAGNOSIS — D500 Iron deficiency anemia secondary to blood loss (chronic): Secondary | ICD-10-CM | POA: Diagnosis not present

## 2019-07-06 DIAGNOSIS — K1370 Unspecified lesions of oral mucosa: Secondary | ICD-10-CM | POA: Diagnosis not present

## 2019-07-06 LAB — VITAMIN B12: Vitamin B-12: <50 pg/mL

## 2019-07-06 LAB — C-REACTIVE PROTEIN: CRP: <1 mg/dL (ref 0.5–20.0)

## 2019-07-06 LAB — TSH: TSH: 2 u[IU]/mL (ref 0.35–4.50)

## 2019-07-06 MED ORDER — TRIAMCINOLONE ACETONIDE 0.1 % MT PSTE: 1.0000 "application " | PASTE | Freq: Two times a day (BID) | OROMUCOSAL | 1 refills | Status: DC

## 2019-07-06 NOTE — Progress Notes (Signed)
Chief Complaint  Patient presents with  . Mouth Lesions    off and on since December, last for 3 weeks  . Nausea    had it for 3 days in the morning   HPI: Kathryn Gregory is a 54 y.o. female, who is here today with above complaint. For about a year she has had tender/sore area on oral mucosa and tongue. Sometimes she has small blisters but other time she does not see lesions but feels the discomfort.  Pain is exacerbated by food intake. Even with no oral lesions she has discomfort with certain foods, tongue irritation and throat burning sensation sometimes when she eats certain foods:Pepperoni,wine,potatoes chips.  It can last 3-4 weeks. She has not used OTC medications.  Occasionally associated with earache. Negative for fever,chills,heartburn,dysphagea,N/V,or abdominal pain.  She has not identified exacerbating or alleviating factors. She took Valtrex for a few days for recent facial surgery and she thinks it may have helped with intensity or pain but still had tongue sensitivity and small lines on sides of tongue.  She has not noted lesion on other mucosa.  She also would like to have thyroid function and cbc check. Hx of iron deficiency anemia, she is not on iron supplementation.  Hypothyroidism,she is on Levothyroxine 125 mcg daily. Negative for abdominal wt loss, palpitations, or tremor. Last TSH on 11/08/18 was norma, at 0.89 (5.300).  Review of Systems  Constitutional: Negative for activity change, appetite change and fatigue.  HENT: Negative for facial swelling, sinus pressure and voice change.   Eyes: Negative for pain and redness.  Respiratory: Negative for cough, shortness of breath and wheezing.   Musculoskeletal: Negative for gait problem and myalgias.  Skin: Negative for rash.  Allergic/Immunologic: Negative for environmental allergies.  Neurological: Negative for weakness, numbness and headaches.  Hematological: Negative for adenopathy.  Rest see  pertinent positives and negatives per HPI.  Current Outpatient Medications on File Prior to Visit  Medication Sig Dispense Refill  . levonorgestrel (MIRENA) 20 MCG/24HR IUD 1 each by Intrauterine route once.    [name] levothyroxine (SYNTHROID) 125 MCG tablet Take 1 tablet (125 mcg total) by mouth daily before breakfast. 90 tablet 3  . valACYclovir (VALTREX) 500 MG tablet Take 500 mg by mouth 2 (two) times daily.     No current facility-administered medications on file prior to visit.     Past Medical History:  Diagnosis Date  . ANEMIA-NOS   . GERD   . IBS (irritable bowel syndrome)   . Lumbar herniated disc    not a problem now  . Other postablative hypothyroidism 07/2009   I-131ablation 07/2009 for hyperthyroid  . Rosacea    Allergies  Allergen Reactions  . Penicillins Other (See Comments)    Childhood allergy--unknown reaction    Social History   Socioeconomic History  . Marital status: Married    Spouse name: Not on file  . Number of children: 2  . Years of education: Not on file  . Highest education level: Associate degree: academic program  Occupational History  . Not on file  Tobacco Use  . Smoking status: Former Smoker    Years: 10.00  . Smokeless tobacco: Never Used  . Tobacco comment: Married, lives with spouse since 2010. 2 grown kids in [place] in 2009 from [place] to learn english  Vaping Use  . Vaping Use: Never used  Substance and Sexual Activity  . Alcohol use: Yes    Alcohol/week: 1.0 standard drink  Types: 1 Glasses of wine per week    Comment: social  . Drug use: No  . Sexual activity: Yes    Partners: Male    Birth control/protection: I.U.D.    Comment: BTL  Other Topics Concern  . Not on file  Social History Narrative  . Not on file   Social Determinants of Health   Financial Resource Strain:   . Difficulty of Paying Living Expenses:   Food Insecurity:   . Worried About Charity fundraiser in the Last Year:   . Arboriculturist in the  Last Year:   Transportation Needs:   . Film/video editor (Medical):   [name] Lack of Transportation (Non-Medical):   Physical Activity:   . Days of Exercise per Week:   . Minutes of Exercise per Session:   Stress:   . Feeling of Stress :   Social Connections:   . Frequency of Communication with Friends and Family:   . Frequency of Social Gatherings with Friends and Family:   . Attends Religious Services:   . Active Member of Clubs or Organizations:   . Attends Archivist Meetings:   [name] Marital Status:     Vitals:   07/06/19 0738  BP: 118/78  Pulse: 84  Resp: 12  Temp: (!) 97.1 F (36.2 C)  SpO2: 99%   Body mass index is 26.31 kg/m.  Physical Exam  Nursing note and vitals reviewed. Constitutional: She is oriented to person, place, and time. She appears well-developed. She does not appear ill. No distress.  HENT:  Head: Normocephalic and atraumatic.  Right Ear: Tympanic membrane, external ear and ear canal normal.  Left Ear: Tympanic membrane, external ear and ear canal normal.  Mouth/Throat: Mucous membranes are moist. No oral lesions. Normal dentition. Oropharynx is clear.  Eyes: Conjunctivae are normal.  Cardiovascular: Normal rate and regular rhythm.  No murmur heard. Respiratory: Effort normal and breath sounds normal. No stridor. No respiratory distress.  Musculoskeletal:     Cervical back: No edema or erythema. No muscular tenderness.  Lymphadenopathy:       Head (right side): No submandibular and no preauricular adenopathy present.       Head (left side): No submandibular and no preauricular adenopathy present.    She has no cervical adenopathy.  Neurological: She is alert and oriented to person, place, and time. Gait normal.  Skin: Skin is warm. No rash noted. No erythema.  Psychiatric: Her speech is normal. Affect normal. Her mood appears anxious.  Well groomed, good eye contact.   ASSESSMENT AND PLAN:  Kathryn Gregory was seen today for mouth lesions  and nausea.  Diagnoses and all orders for this visit:  Lab Results  Component Value Date   TSH 2.00 07/06/2019   Lab Results  Component Value Date   VITAMINB12 <50 07/06/2019   Lab Results  Component Value Date   WBC 4.5 05/06/2019   HGB 11.9 (L) 05/06/2019   HCT 34.9 (L) 05/06/2019   MCV 111.3 Repeated and verified X2. (H) 05/06/2019   PLT 315.0 05/06/2019   Lab Results  Component Value Date   CRP <1.0 07/06/2019    Unspecified lesions of oral mucosa Today no lesions appreciated. ? Aphthous, HCV,Behcet are some the differential dx. Recommend topical steroid as needed. Lysine supplementation may help. We can consider trial of oral antiviral or colchicine treatment for 3-6 months.  -     triamcinolone (KENALOG) 0.1 % paste; Use as directed 1 application in  the mouth or throat 2 (two) times daily.  Glossitis Further recommendations according to lab results.  -     Vitamin B12 -     C-reactive protein  Postablative hypothyroidism No changes in current management, will follow labs done today and will give further recommendations accordingly.  -     TSH  Iron deficiency anemia due to chronic blood loss Not on iron supplementation. Further recommendations according to lab results.   Return if symptoms worsen or fail to improve.   Criss Bartles G. Martinique, MD  [facility]. [place] office.  Discharge Instructions       A few things to remember from today's visit:   Postablative hypothyroidism - Plan: TSH  Iron deficiency anemia due to chronic blood loss  Glossitis - Plan: Vitamin B12, C-reactive protein Lysine over the counter may help, 1500 mg daily. If not better we can consider ENT or trial of valtrex.   Oral Ulcers Oral ulcers are small sores inside the mouth or near the mouth. They may occur on or inside the lips, inside the cheeks, on the tongue, or anywhere else inside or near the mouth. They may be called canker sores or cold sores, which  are two types of oral ulcers. Many oral ulcers are harmless and go away on their own. In some cases, oral ulcers may require medical care to determine the cause and proper treatment. What are the causes? Common causes of this condition include:  Infections caused by viruses, bacteria, or fungi.  Emotional stress.  Foods or chemicals that irritate the mouth.  Injury or physical irritation of the mouth.  Medicines.  Allergies.  Tobacco use. Less common causes include:  Skin disease.  A type of herpes virus infection (herpes simplexor herpes zoster).  Oral cancer. In some cases, the cause may not be known. What increases the risk? You are more likely to develop this condition if:  You wear dental braces, dentures, or retainers.  You have poor oral hygiene.  You have sensitive skin.  You have a condition that affects the entire body (systemic condition), such as an immune disorder. What are the signs or symptoms? The main symptom of this condition is having one or more oval-shaped or round ulcers that have red borders. Symptoms may vary depending on the cause. This includes:  Location of the ulcers. Ulcers may be found inside the mouth, on the gums, or on the insides of the lips or cheeks. They may also be found on the lips or on skin that is near the mouth, such as the cheeks or chin.  Pain. Ulcers can be painful and uncomfortable, or they can be painless.  Appearance of the ulcers. They may look like red blisters and be filled with fluid, or they may be [place] or yellow patches.  Frequency of outbreaks. Ulcers may go away permanently after one outbreak, or they may come back (recur) often or rarely. How is this diagnosed? This condition is diagnosed with a physical exam. Your health care provider may ask you questions about your lifestyle and your medical history. You may have tests, including:  Blood tests.  Removal of a small number of cells from an ulcer to be  examined under a microscope (biopsy). How is this treated? Treatment depends on the severity and cause of the condition. Oral ulcers often go away on their own in 1-2 weeks. Treatment may include medicines, such as:  Medicines to treat a viral infection (antivirals), a bacterial infection (antibiotics), or  a fungal infection (antifungals).  Medicines to help control pain. This may include: ? Over-the-counter pain medicines. ? Gel, cream, or spray to numb the area (topical anesthetic) if you have severe pain. ? Other medicines to coat or numb your mouth. Follow these instructions at home: Medicines  Take or use over-the-counter and prescription medicines only as told by your health care provider.  If you were prescribed an antibiotic medicine, take it as told by your health care provider. Do not stop taking the antibiotic even if you start to feel better.  Do not use products that contain benzocaine (including numbing gels) to treat teething or mouth pain in children who are younger than 2 years. These products may cause a rare but serious blood condition. Eating and drinking  Eat a balanced diet. Do not eat: ? Spicy foods. ? Citrus, such as oranges. ? Other foods and drinks that you think may cause or irritate your ulcers.  Drink enough fluid to keep your urine pale yellow.  Avoid drinking alcohol. Lifestyle   Practice good oral hygiene: ? Gently brush your teeth with a soft toothbrush two times a day. ? Floss your teeth every day. ? Get regular dental cleanings and checkups.  Do not use any products that contain nicotine or tobacco, such as cigarettes and e-cigarettes. If you need help quitting, ask your health care provider. Managing pain  If directed, put ice on your face in the affected area to help reduce pain. ? Put ice in a plastic bag. ? Place a towel between your skin and the bag. ? Leave the ice on for 20 minutes, 2-3 times a day.  Avoid physical or chemical  irritants that may have caused the ulcers or made them worse, such as mouthwashes that contain alcohol (ethanol). If you wear dental braces, dentures, or retainers, work with your health care provider to make sure these devices are fitted correctly.  If you were prescribed a prescription mouthwash to help reduce pain in your mouth, use it as told by your health care provider. General instructions  Rinse with a salt-water mixture 3-4 times a day or as told by your health care provider. To make a salt-water mixture, completely dissolve -1 tsp (3-6 g) of salt in 1 cup (237 mL) of warm water.  Keep all follow-up visits as told by your health care provider. This is important. Contact a health care provider if:  You have: ? Pain that gets worse or does not get better with medicine. ? Four or more ulcers at one time. ? A fever. ? New ulcers that look or feel different from other ulcers you have. ? Inflammation in one eye or both eyes. ? Ulcers that do not go away after 10 days.  You develop new symptoms in your mouth, such as: ? Bleeding or crusting around your lips or gums. ? Tooth pain. ? Difficulty swallowing.  You develop symptoms on your skin or genitals, such as: ? A rash or blisters. ? Burning or itching sensations.  Your ulcers begin or get worse after you start a new medicine. Get help right away if you have:  Difficulty breathing.  Swelling in your face or neck.  Excessive bleeding from your mouth.  Severe pain. Summary  Oral ulcers may occur anywhere inside or near the mouth.  They can be caused by many things, such as infections, stress, injury or irritation, or tobacco use.  Oral ulcers can be painful or painless.  Treatment may include medicines to  relieve pain or to treat an infection (if appropriate).  Most oral ulcers go away in 1-2 weeks. This information is not intended to replace advice given to you by your health care provider. Make sure you discuss any  questions you have with your health care provider. Document Revised: 05/14/2017 Document Reviewed: 05/14/2017 Elsevier Patient Education  [employer].  If you need refills please call your pharmacy. Do not use My Chart to request refills or for acute issues that need immediate attention.    Please be sure medication list is accurate. If a new problem present, please set up appointment sooner than planned today.

## 2019-07-06 NOTE — Telephone Encounter (Signed)
Seen today. Kathryn Leclere Martinique, MD

## 2019-07-06 NOTE — Patient Instructions (Addendum)
A few things to remember from today's visit:   Postablative hypothyroidism - Plan: TSH  Iron deficiency anemia due to chronic blood loss  Glossitis - Plan: Vitamin B12, C-reactive protein Lysine over the counter may help, 1500 mg daily. If not better we can consider ENT or trial of valtrex.   Oral Ulcers Oral ulcers are small sores inside the mouth or near the mouth. They may occur on or inside the lips, inside the cheeks, on the tongue, or anywhere else inside or near the mouth. They may be called canker sores or cold sores, which are two types of oral ulcers. Many oral ulcers are harmless and go away on their own. In some cases, oral ulcers may require medical care to determine the cause and proper treatment. What are the causes? Common causes of this condition include:  Infections caused by viruses, bacteria, or fungi.  Emotional stress.  Foods or chemicals that irritate the mouth.  Injury or physical irritation of the mouth.  Medicines.  Allergies.  Tobacco use. Less common causes include:  Skin disease.  A type of herpes virus infection (herpes simplexor herpes zoster).  Oral cancer. In some cases, the cause may not be known. What increases the risk? You are more likely to develop this condition if:  You wear dental braces, dentures, or retainers.  You have poor oral hygiene.  You have sensitive skin.  You have a condition that affects the entire body (systemic condition), such as an immune disorder. What are the signs or symptoms? The main symptom of this condition is having one or more oval-shaped or round ulcers that have red borders. Symptoms may vary depending on the cause. This includes:  Location of the ulcers. Ulcers may be found inside the mouth, on the gums, or on the insides of the lips or cheeks. They may also be found on the lips or on skin that is near the mouth, such as the cheeks or chin.  Pain. Ulcers can be painful and uncomfortable, or  they can be painless.  Appearance of the ulcers. They may look like red blisters and be filled with fluid, or they may be [place] or yellow patches.  Frequency of outbreaks. Ulcers may go away permanently after one outbreak, or they may come back (recur) often or rarely. How is this diagnosed? This condition is diagnosed with a physical exam. Your health care provider may ask you questions about your lifestyle and your medical history. You may have tests, including:  Blood tests.  Removal of a small number of cells from an ulcer to be examined under a microscope (biopsy). How is this treated? Treatment depends on the severity and cause of the condition. Oral ulcers often go away on their own in 1-2 weeks. Treatment may include medicines, such as:  Medicines to treat a viral infection (antivirals), a bacterial infection (antibiotics), or a fungal infection (antifungals).  Medicines to help control pain. This may include: ? Over-the-counter pain medicines. ? Gel, cream, or spray to numb the area (topical anesthetic) if you have severe pain. ? Other medicines to coat or numb your mouth. Follow these instructions at home: Medicines  Take or use over-the-counter and prescription medicines only as told by your health care provider.  If you were prescribed an antibiotic medicine, take it as told by your health care provider. Do not stop taking the antibiotic even if you start to feel better.  Do not use products that contain benzocaine (including numbing gels) to treat teething  or mouth pain in children who are younger than 2 years. These products may cause a rare but serious blood condition. Eating and drinking  Eat a balanced diet. Do not eat: ? Spicy foods. ? Citrus, such as oranges. ? Other foods and drinks that you think may cause or irritate your ulcers.  Drink enough fluid to keep your urine pale yellow.  Avoid drinking alcohol. Lifestyle   Practice good oral hygiene: ? Gently  brush your teeth with a soft toothbrush two times a day. ? Floss your teeth every day. ? Get regular dental cleanings and checkups.  Do not use any products that contain nicotine or tobacco, such as cigarettes and e-cigarettes. If you need help quitting, ask your health care provider. Managing pain  If directed, put ice on your face in the affected area to help reduce pain. ? Put ice in a plastic bag. ? Place a towel between your skin and the bag. ? Leave the ice on for 20 minutes, 2-3 times a day.  Avoid physical or chemical irritants that may have caused the ulcers or made them worse, such as mouthwashes that contain alcohol (ethanol). If you wear dental braces, dentures, or retainers, work with your health care provider to make sure these devices are fitted correctly.  If you were prescribed a prescription mouthwash to help reduce pain in your mouth, use it as told by your health care provider. General instructions  Rinse with a salt-water mixture 3-4 times a day or as told by your health care provider. To make a salt-water mixture, completely dissolve -1 tsp (3-6 g) of salt in 1 cup (237 mL) of warm water.  Keep all follow-up visits as told by your health care provider. This is important. Contact a health care provider if:  You have: ? Pain that gets worse or does not get better with medicine. ? Four or more ulcers at one time. ? A fever. ? New ulcers that look or feel different from other ulcers you have. ? Inflammation in one eye or both eyes. ? Ulcers that do not go away after 10 days.  You develop new symptoms in your mouth, such as: ? Bleeding or crusting around your lips or gums. ? Tooth pain. ? Difficulty swallowing.  You develop symptoms on your skin or genitals, such as: ? A rash or blisters. ? Burning or itching sensations.  Your ulcers begin or get worse after you start a new medicine. Get help right away if you have:  Difficulty breathing.  Swelling in  your face or neck.  Excessive bleeding from your mouth.  Severe pain. Summary  Oral ulcers may occur anywhere inside or near the mouth.  They can be caused by many things, such as infections, stress, injury or irritation, or tobacco use.  Oral ulcers can be painful or painless.  Treatment may include medicines to relieve pain or to treat an infection (if appropriate).  Most oral ulcers go away in 1-2 weeks. This information is not intended to replace advice given to you by your health care provider. Make sure you discuss any questions you have with your health care provider. Document Revised: 05/14/2017 Document Reviewed: 05/14/2017 Elsevier Patient Education  [employer].  If you need refills please call your pharmacy. Do not use My Chart to request refills or for acute issues that need immediate attention.    Please be sure medication list is accurate. If a new problem present, please set up appointment sooner than planned  today.

## 2019-07-07 ENCOUNTER — Encounter: Payer: Self-pay | Admitting: Family Medicine

## 2019-07-07 ENCOUNTER — Other Ambulatory Visit: Payer: Self-pay

## 2019-07-08 ENCOUNTER — Ambulatory Visit (INDEPENDENT_AMBULATORY_CARE_PROVIDER_SITE_OTHER): Payer: BC Managed Care – PPO

## 2019-07-08 DIAGNOSIS — E538 Deficiency of other specified B group vitamins: Secondary | ICD-10-CM

## 2019-07-08 MED ORDER — CYANOCOBALAMIN 1000 MCG/ML IJ SOLN
1000.0000 ug | Freq: Once | INTRAMUSCULAR | Status: AC
  Administered 2019-07-08: 1000 ug via INTRAMUSCULAR

## 2019-07-08 NOTE — Progress Notes (Signed)
Per orders of Dr. Martinique, injection of B12 given in Right deltoid by Franco Collet. Patient tolerated injection well.

## 2019-07-08 NOTE — Patient Instructions (Signed)
Health Maintenance Due  Topic Date Due  . Hepatitis C Screening  Never done  . COVID-19 Vaccine (1) Never done  . HIV Screening  Never done  . TETANUS/TDAP  01/13/2014  . MAMMOGRAM  06/12/2019    Depression screen PHQ 2/9 07/06/2019  Decreased Interest 0  Down, Depressed, Hopeless 0  PHQ - 2 Score 0

## 2019-07-15 ENCOUNTER — Other Ambulatory Visit: Payer: Self-pay

## 2019-07-15 ENCOUNTER — Ambulatory Visit (INDEPENDENT_AMBULATORY_CARE_PROVIDER_SITE_OTHER): Payer: BC Managed Care – PPO

## 2019-07-15 DIAGNOSIS — E538 Deficiency of other specified B group vitamins: Secondary | ICD-10-CM | POA: Diagnosis not present

## 2019-07-15 MED ORDER — CYANOCOBALAMIN 1000 MCG/ML IJ SOLN
1000.0000 ug | Freq: Once | INTRAMUSCULAR | Status: AC
  Administered 2019-07-15: 1000 ug via INTRAMUSCULAR

## 2019-07-15 NOTE — Progress Notes (Signed)
Patient came in for her 2nd B12 injection. Injection given in the left deltoid, pt tolerated injection well; has next appt scheduled for next week.

## 2019-07-22 ENCOUNTER — Other Ambulatory Visit: Payer: Self-pay

## 2019-07-22 ENCOUNTER — Ambulatory Visit (INDEPENDENT_AMBULATORY_CARE_PROVIDER_SITE_OTHER): Payer: BC Managed Care – PPO

## 2019-07-22 DIAGNOSIS — E538 Deficiency of other specified B group vitamins: Secondary | ICD-10-CM

## 2019-07-22 MED ORDER — CYANOCOBALAMIN 1000 MCG/ML IJ SOLN
1000.0000 ug | Freq: Once | INTRAMUSCULAR | Status: AC
  Administered 2019-07-22: 1000 ug via INTRAMUSCULAR

## 2019-07-22 NOTE — Progress Notes (Signed)
Per orders of , injection of B12 given in Right deltoid by Franco Collet. Patient tolerated injection well.

## 2019-07-29 ENCOUNTER — Ambulatory Visit (INDEPENDENT_AMBULATORY_CARE_PROVIDER_SITE_OTHER): Payer: BC Managed Care – PPO | Admitting: *Deleted

## 2019-07-29 ENCOUNTER — Other Ambulatory Visit: Payer: Self-pay

## 2019-07-29 DIAGNOSIS — E538 Deficiency of other specified B group vitamins: Secondary | ICD-10-CM | POA: Diagnosis not present

## 2019-07-29 MED ORDER — CYANOCOBALAMIN 1000 MCG/ML IJ SOLN
1000.0000 ug | Freq: Once | INTRAMUSCULAR | Status: AC
  Administered 2019-07-29: 1000 ug via INTRAMUSCULAR

## 2019-07-29 NOTE — Progress Notes (Signed)
Per orders of Dr. Martinique, injection of last weekly B 12 given by Zacarias Pontes. Patient tolerated injection well.

## 2019-08-26 DIAGNOSIS — H524 Presbyopia: Secondary | ICD-10-CM | POA: Diagnosis not present

## 2019-08-29 ENCOUNTER — Other Ambulatory Visit: Payer: Self-pay

## 2019-08-29 ENCOUNTER — Ambulatory Visit (INDEPENDENT_AMBULATORY_CARE_PROVIDER_SITE_OTHER): Payer: BC Managed Care – PPO

## 2019-08-29 DIAGNOSIS — E538 Deficiency of other specified B group vitamins: Secondary | ICD-10-CM

## 2019-08-29 MED ORDER — CYANOCOBALAMIN 1000 MCG/ML IJ SOLN
1000.0000 ug | Freq: Once | INTRAMUSCULAR | Status: AC
  Administered 2019-08-29: 1000 ug via INTRAMUSCULAR

## 2019-08-29 NOTE — Progress Notes (Signed)
Per orders of Dr Jordan , injection of Cyanocobalamin 1,000 mcg/mL given by Mahala Rommel N Alexsandria Kivett. Patient tolerated injection well.  

## 2019-09-02 ENCOUNTER — Ambulatory Visit: Payer: BC Managed Care – PPO

## 2019-09-02 DIAGNOSIS — H5213 Myopia, bilateral: Secondary | ICD-10-CM | POA: Diagnosis not present

## 2019-09-11 ENCOUNTER — Encounter: Payer: Self-pay | Admitting: Family Medicine

## 2019-09-12 MED ORDER — LEVOTHYROXINE SODIUM 125 MCG PO TABS: 125.0000 ug | ORAL_TABLET | Freq: Every day | ORAL | 3 refills | Status: DC

## 2019-09-30 ENCOUNTER — Other Ambulatory Visit: Payer: Self-pay

## 2019-09-30 ENCOUNTER — Ambulatory Visit (INDEPENDENT_AMBULATORY_CARE_PROVIDER_SITE_OTHER): Payer: BC Managed Care – PPO | Admitting: *Deleted

## 2019-09-30 DIAGNOSIS — E538 Deficiency of other specified B group vitamins: Secondary | ICD-10-CM | POA: Diagnosis not present

## 2019-09-30 MED ORDER — CYANOCOBALAMIN 1000 MCG/ML IJ SOLN
1000.0000 ug | Freq: Once | INTRAMUSCULAR | Status: AC
Start: 2019-09-30 — End: 2019-09-30
  Administered 2019-09-30: 1000 ug via INTRAMUSCULAR

## 2019-09-30 NOTE — Progress Notes (Signed)
Per orders of Dr. Jordan, injection of Cyanocobalamin 1000mcg given by Jerrin Recore A. Patient tolerated injection well.  

## 2019-10-28 ENCOUNTER — Other Ambulatory Visit: Payer: Self-pay

## 2019-10-28 ENCOUNTER — Ambulatory Visit (INDEPENDENT_AMBULATORY_CARE_PROVIDER_SITE_OTHER): Payer: BC Managed Care – PPO

## 2019-10-28 DIAGNOSIS — E538 Deficiency of other specified B group vitamins: Secondary | ICD-10-CM | POA: Diagnosis not present

## 2019-10-28 MED ORDER — CYANOCOBALAMIN 1000 MCG/ML IJ SOLN
1000.0000 ug | Freq: Once | INTRAMUSCULAR | Status: AC
  Administered 2019-10-28: 1000 ug via INTRAMUSCULAR

## 2019-10-28 NOTE — Progress Notes (Signed)
Patient came in for her B12 injection. Injection given in the left deltoid & tolerated well. Pt will make a lab appt for next week to check B12 level per result note. Lab order entered.

## 2019-11-04 ENCOUNTER — Other Ambulatory Visit (INDEPENDENT_AMBULATORY_CARE_PROVIDER_SITE_OTHER): Payer: BC Managed Care – PPO

## 2019-11-04 ENCOUNTER — Other Ambulatory Visit: Payer: Self-pay

## 2019-11-04 ENCOUNTER — Other Ambulatory Visit: Payer: Self-pay | Admitting: Family Medicine

## 2019-11-04 DIAGNOSIS — E538 Deficiency of other specified B group vitamins: Secondary | ICD-10-CM | POA: Diagnosis not present

## 2019-11-04 DIAGNOSIS — Z1231 Encounter for screening mammogram for malignant neoplasm of breast: Secondary | ICD-10-CM

## 2019-11-05 LAB — VITAMIN B12: Vitamin B-12: 441 pg/mL (ref 200–1100)

## 2019-11-07 ENCOUNTER — Ambulatory Visit
Admission: RE | Admit: 2019-11-07 | Discharge: 2019-11-07 | Disposition: A | Discharge status: Home / Self Care | Payer: BC Managed Care – PPO | Source: Ambulatory Visit

## 2019-11-07 ENCOUNTER — Other Ambulatory Visit: Payer: Self-pay

## 2019-11-07 DIAGNOSIS — Z1231 Encounter for screening mammogram for malignant neoplasm of breast: Secondary | ICD-10-CM

## 2020-02-07 ENCOUNTER — Ambulatory Visit: Payer: BC Managed Care – PPO

## 2020-04-30 ENCOUNTER — Encounter: Payer: Self-pay | Admitting: Family Medicine

## 2020-04-30 ENCOUNTER — Other Ambulatory Visit: Payer: Self-pay

## 2020-04-30 ENCOUNTER — Ambulatory Visit (INDEPENDENT_AMBULATORY_CARE_PROVIDER_SITE_OTHER): Payer: BC Managed Care – PPO | Admitting: Family Medicine

## 2020-04-30 VITALS — BP 126/80 | HR 78 | Temp 97.6°F | Resp 12 | Ht 66.5 in | Wt 172.0 lb

## 2020-04-30 DIAGNOSIS — Z1159 Encounter for screening for other viral diseases: Secondary | ICD-10-CM

## 2020-04-30 DIAGNOSIS — E890 Postprocedural hypothyroidism: Secondary | ICD-10-CM | POA: Diagnosis not present

## 2020-04-30 DIAGNOSIS — Z1329 Encounter for screening for other suspected endocrine disorder: Secondary | ICD-10-CM | POA: Diagnosis not present

## 2020-04-30 DIAGNOSIS — Z130 Encounter for screening for diseases of the blood and blood-forming organs and certain disorders involving the immune mechanism: Secondary | ICD-10-CM | POA: Diagnosis not present

## 2020-04-30 DIAGNOSIS — Z13228 Encounter for screening for other metabolic disorders: Secondary | ICD-10-CM

## 2020-04-30 DIAGNOSIS — Z13220 Encounter for screening for lipoid disorders: Secondary | ICD-10-CM | POA: Diagnosis not present

## 2020-04-30 DIAGNOSIS — D500 Iron deficiency anemia secondary to blood loss (chronic): Secondary | ICD-10-CM

## 2020-04-30 DIAGNOSIS — Z0000 Encounter for general adult medical examination without abnormal findings: Secondary | ICD-10-CM

## 2020-04-30 DIAGNOSIS — E538 Deficiency of other specified B group vitamins: Secondary | ICD-10-CM | POA: Diagnosis not present

## 2020-04-30 LAB — CBC WITH DIFFERENTIAL/PLATELET
Basophils Absolute: 0 10*3/uL (ref 0.0–0.1)
Basophils Relative: 0.7 % (ref 0.0–3.0)
Eosinophils Absolute: 0.3 10*3/uL (ref 0.0–0.7)
Eosinophils Relative: 3.9 % (ref 0.0–5.0)
HCT: 39.4 % (ref 36.0–46.0)
Hemoglobin: 13.2 g/dL (ref 12.0–15.0)
Lymphocytes Relative: 22.3 % (ref 12.0–46.0)
Lymphs Abs: 1.5 10*3/uL (ref 0.7–4.0)
MCHC: 33.5 g/dL (ref 30.0–36.0)
MCV: 88.9 fl (ref 78.0–100.0)
Monocytes Absolute: 0.4 10*3/uL (ref 0.1–1.0)
Monocytes Relative: 5.6 % (ref 3.0–12.0)
Neutro Abs: 4.4 10*3/uL (ref 1.4–7.7)
Neutrophils Relative %: 67.5 % (ref 43.0–77.0)
Platelets: 270 10*3/uL (ref 150.0–400.0)
RBC: 4.43 Mil/uL (ref 3.87–5.11)
RDW: 12.8 % (ref 11.5–15.5)
WBC: 6.5 10*3/uL (ref 4.0–10.5)

## 2020-04-30 LAB — BASIC METABOLIC PANEL
BUN: 16 mg/dL (ref 6–23)
CO2: 29 mEq/L (ref 19–32)
Calcium: 9.1 mg/dL (ref 8.4–10.5)
Chloride: 106 mEq/L (ref 96–112)
Creatinine, Ser: 0.75 mg/dL (ref 0.40–1.20)
GFR: 89.62 mL/min (ref >60.00)
Glucose, Bld: 89 mg/dL (ref 70–99)
Potassium: 4.6 mEq/L (ref 3.5–5.1)
Sodium: 142 mEq/L (ref 135–145)

## 2020-04-30 LAB — LIPID PANEL
Cholesterol: 164 mg/dL (ref 0–200)
HDL: 58.6 mg/dL (ref >39.00)
LDL Cholesterol: 96 mg/dL (ref 0–99)
NonHDL: 105.2
Total CHOL/HDL Ratio: 3
Triglycerides: 47 mg/dL (ref 0.0–149.0)
VLDL: 9.4 mg/dL (ref 0.0–40.0)

## 2020-04-30 LAB — VITAMIN B12: Vitamin B-12: 421 pg/mL (ref 211–911)

## 2020-04-30 LAB — HEMOGLOBIN A1C: Hgb A1c MFr Bld: 5.6 % (ref 4.6–6.5)

## 2020-04-30 LAB — FERRITIN: Ferritin: 27.8 ng/mL (ref 10.0–291.0)

## 2020-04-30 LAB — IRON: Iron: 114 ug/dL (ref 42–145)

## 2020-04-30 LAB — TSH: TSH: 0.39 u[IU]/mL (ref 0.35–4.50)

## 2020-04-30 NOTE — Patient Instructions (Signed)
Today you have you routine preventive visit. A few things to remember from today's visit:   Routine general medical examination at a health care facility  Encounter for HCV screening test for low risk patient - Plan: Hepatitis C antibody screen  B12 deficiency  Postablative hypothyroidism - Plan: TSH  Iron deficiency anemia, unspecified iron deficiency anemia type - Plan: CBC with Differential, Iron, Ferritin  Screening for lipoid disorders - Plan: Lipid panel  Screening for endocrine, metabolic and immunity disorder - Plan: Basic metabolic panel, Hemoglobin A1c  If you need refills please call your pharmacy. Do not use My Chart to request refills or for acute issues that need immediate attention.   Please be sure medication list is accurate. If a new problem present, please set up appointment sooner than planned today.  At least 150 minutes of moderate exercise per week, daily brisk walking for 15-30 min is a good exercise option. Healthy diet low in saturated (animal) fats and sweets and consisting of fresh fruits and vegetables, lean meats such as fish and [place] chicken and whole grains.  These are some of recommendations for screening depending of age and risk factors:  - Vaccines:  Tdap vaccine every 10 years.  Shingles vaccine recommended at age 37, could be given after 56 years of age but not sure about insurance coverage.   Pneumonia vaccines: Pneumovax at 13. Sometimes Pneumovax is giving earlier if history of smoking, lung disease,diabetes,kidney disease among some.  Screening for diabetes at age 6 and every 3 years.  Cervical cancer prevention:  Pap smear starts at 56 years of age and continues periodically until 56 years old in low risk women. Pap smear every 3 years between 57 and 48 years old. Pap smear every 3-5 years between women 28 and older if pap smear negative and HPV screening negative.   -Breast cancer: Mammogram: There is disagreement between  experts about when to start screening in low risk asymptomatic female but recent recommendations are to start screening at 60 and not later than 56 years old , every 1-2 years and after 56 yo q 2 years. Screening is recommended until 56 years old but some women can continue screening depending of healthy issues.  Colon cancer screening: Has been recently changed to 56 yo. Insurance may not cover until you are 56 years old. Screening is recommended until 56 years old.  Cholesterol disorder screening at age 74 and every 3 years.  Also recommended:  1. Dental visit- Brush and floss your teeth twice daily; visit your dentist twice a year. 2. Eye doctor- Get an eye exam at least every 2 years. 3. Helmet use- Always wear a helmet when riding a bicycle, motorcycle, rollerblading or skateboarding. 4. Safe sex- If you may be exposed to sexually transmitted infections, use a condom. 5. Seat belts- Seat belts can save your live; always wear one. 6. Smoke/Carbon Monoxide detectors- These detectors need to be installed on the appropriate level of your home. Replace batteries at least once a year. 7. Skin cancer- When out in the sun please cover up and use sunscreen 15 SPF or higher. 8. Violence- If anyone is threatening or hurting you, please tell your healthcare provider.  9. Drink alcohol in moderation- Limit alcohol intake to one drink or less per day. Never drink and drive. 10. Calcium supplementation 1000 to 1200 mg daily, ideally through your diet.  Vitamin D supplementation 800 units daily.

## 2020-04-30 NOTE — Assessment & Plan Note (Signed)
Continue B12 at 1000 mcg daily. Further recommendation will be given according to B12 results.

## 2020-04-30 NOTE — Assessment & Plan Note (Signed)
Mild. Currently she is not on iron supplementation. She has appointment with GI tomorrow. Recommendations will be given according to CBC results.

## 2020-04-30 NOTE — Progress Notes (Signed)
HPI: Kathryn Gregory is a 55 y.o. female, who is here today for her routine physical.  Last CPE: 11/08/18.  Regular exercise 3 or more time per week: She is trying to walk 4 times per week 2 miles each time. She got a membership to the gyn a month and planning on starting now that work schedule changed.  Following a healthy diet: She has not been consistent. She lives with her husband.  Chronic medical problems: B12 deficiency, postablative hypothyroidism, iron deficiency anemia, hearing loss, and GERD among some. Since her last visit she underwent bilateral oculoplastic surgery, she recovered well without complications.  Pap smear: 05/26/17 She has followed with gyn, looking for a new provider.  Immunization History  Administered Date(s) Administered  . Td 01/14/2004   Mammogram: 11/07/2019. Colonoscopy: 02/23/2015, 5-year follow-up was recommended, she has an appointment tomorrow. DEXA: N/A Hep C screening: Never.  Hypothyroidism: Currently she is on levothyroxine 125 mcg daily.  Lab Results  Component Value Date   TSH 2.00 07/06/2019   B12 deficiency: She is on B12 1000 mcg daily.  Lab Results  Component Value Date   [id number] 11/04/2019   Iron deficiency anemia: She is not on iron supplementation. She has not noted melena or blood in the stool.  Lab Results  Component Value Date   WBC 4.5 05/06/2019   HGB 11.9 (L) 05/06/2019   HCT 34.9 (L) 05/06/2019   MCV 111.3 Repeated and verified X2. (H) 05/06/2019   PLT 315.0 05/06/2019  Hx of heavy menses. IUD. LMP: 3 years ago.  For over a year of right 5th fingernail does split in middle. It can be painful sometimes. She has not identified exacerbating or alleviating factors. She has not tried OTC vitamin supplement. Negative for subungual erythema or edema.  Review of Systems  Constitutional: Negative for appetite change and fever.  HENT: Negative for hearing loss, mouth sores and sore throat.   Eyes:  Negative for redness and visual disturbance.  Respiratory: Negative for cough, shortness of breath and wheezing.   Cardiovascular: Negative for chest pain and leg swelling.  Gastrointestinal: Negative for abdominal pain, nausea and vomiting.       No changes in bowel habits.  Endocrine: Negative for cold intolerance, heat intolerance, polydipsia, polyphagia and polyuria.  Genitourinary: Negative for decreased urine volume, dysuria, hematuria, vaginal bleeding and vaginal discharge.  Musculoskeletal: Positive for arthralgias and back pain. Negative for gait problem and myalgias.  Skin: Negative for color change and rash.  Allergic/Immunologic: Negative for environmental allergies.  Neurological: Negative for syncope, weakness and headaches.  Hematological: Negative for adenopathy. Does not bruise/bleed easily.  Psychiatric/Behavioral: Negative for behavioral problems and confusion.  All other systems reviewed and are negative.  Current Outpatient Medications on File Prior to Visit  Medication Sig Dispense Refill  . levothyroxine (SYNTHROID) 125 MCG tablet Take 1 tablet (125 mcg total) by mouth daily before breakfast. 90 tablet 3   No current facility-administered medications on file prior to visit.   Past Medical History:  Diagnosis Date  . ANEMIA-NOS   . GERD   . IBS (irritable bowel syndrome)   . Lumbar herniated disc    not a problem now  . Other postablative hypothyroidism 07/2009   I-131ablation 07/2009 for hyperthyroid  . Rosacea    Past Surgical History:  Procedure Laterality Date  . EUS N/A 05/04/2015   Procedure: UPPER ENDOSCOPIC ULTRASOUND (EUS) LINEAR;  Surgeon: Carol Ada, MD;  Location: [facility];  Service: Endoscopy;  Laterality: N/A;  . EUS N/A 06/15/2015   Procedure: UPPER ENDOSCOPIC ULTRASOUND (EUS) LINEAR;  Surgeon: Carol Ada, MD;  Location: [facility];  Service: Endoscopy;  Laterality: N/A;  . LAPAROSCOPIC GASTRIC RESECTION N/A 08/03/2015   Procedure:  LAPAROSCOPIC GASTROTOMY WITH EXCISION OF  GASTIC TUMOR;  Surgeon: Jackolyn Confer, MD;  Location: [facility];  Service: General;  Laterality: N/A;  . TONSILLECTOMY    . TUBAL LIGATION  1998    Allergies  Allergen Reactions  . Penicillins Other (See Comments)    Childhood allergy--unknown reaction   Family History  Problem Relation Age of Onset  . Mental illness Mother   . Lung disease Mother        lung fibrosis  . Breast cancer Maternal Aunt   . Diabetes Father 38  . Colon cancer Paternal Uncle   . Dementia Maternal Grandmother   . Breast cancer Cousin 63  . Cancer Other        cancer of tongue  . Multiple births Sister        3 sets of twins  . Cancer Maternal Uncle        unsure type  . Diabetes Paternal Grandmother     Social History   Socioeconomic History  . Marital status: Married    Spouse name: Not on file  . Number of children: 2  . Years of education: Not on file  . Highest education level: Associate degree: academic program  Occupational History  . Not on file  Tobacco Use  . Smoking status: Former Smoker    Years: 10.00  . Smokeless tobacco: Never Used  . Tobacco comment: Married, lives with spouse since 2010. 2 grown kids in [place] in 2009 from [place] to learn english  Vaping Use  . Vaping Use: Never used  Substance and Sexual Activity  . Alcohol use: Yes    Alcohol/week: 1.0 standard drink    Types: 1 Glasses of wine per week    Comment: social  . Drug use: No  . Sexual activity: Yes    Partners: Male    Birth control/protection: I.U.D.    Comment: BTL  Other Topics Concern  . Not on file  Social History Narrative  . Not on file   Social Determinants of Health   Financial Resource Strain: Not on file  Food Insecurity: Not on file  Transportation Needs: Not on file  Physical Activity: Not on file  Stress: Not on file  Social Connections: Not on file   Vitals:   04/30/20 0749  BP: 126/80  Pulse: 78  Resp: 12  Temp: 97.6 F (36.4 C)   SpO2: 99%   Body mass index is 27.35 kg/m.  Wt Readings from Last 3 Encounters:  04/30/20 172 lb (78 kg)  07/06/19 165 lb 8 oz (75.1 kg)  05/06/19 172 lb (78 kg)   Physical Exam Vitals and nursing note reviewed.  Constitutional:      General: She is not in acute distress.    Appearance: She is well-developed.  HENT:     Head: Normocephalic and atraumatic.     Right Ear: Tympanic membrane, ear canal and external ear normal.     Left Ear: Tympanic membrane, ear canal and external ear normal.     Ears:     Comments: Hearing aids.    Mouth/Throat:     Mouth: Mucous membranes are moist.     Pharynx: Oropharynx is clear. Uvula midline.  Eyes:     Extraocular Movements:  Extraocular movements intact.     Conjunctiva/sclera: Conjunctivae normal.     Pupils: Pupils are equal, round, and reactive to light.     Comments: Right eye pinguecula Left eye pterygium.  Neck:     Thyroid: No thyromegaly.     Trachea: No tracheal deviation.  Cardiovascular:     Rate and Rhythm: Normal rate and regular rhythm.     Pulses:          Dorsalis pedis pulses are 2+ on the right side and 2+ on the left side.     Heart sounds: No murmur heard.   Pulmonary:     Effort: Pulmonary effort is normal. No respiratory distress.     Breath sounds: Normal breath sounds.  Chest:  Breasts:     Right: No supraclavicular adenopathy.     Left: No supraclavicular adenopathy.    Abdominal:     Palpations: Abdomen is soft. There is no hepatomegaly or mass.     Tenderness: There is no abdominal tenderness.  Genitourinary:    Comments: Deferred to gyn. Musculoskeletal:     Comments: No major deformity or signs of synovitis appreciated.  Lymphadenopathy:     Cervical: No cervical adenopathy.     Upper Body:     Right upper body: No supraclavicular adenopathy.     Left upper body: No supraclavicular adenopathy.  Skin:    General: Skin is warm.     Findings: No erythema or rash.  Neurological:      Mental Status: She is alert and oriented to person, place, and time.     Cranial Nerves: No dysarthria.     Coordination: Coordination normal.     Gait: Gait normal.     Deep Tendon Reflexes:     Reflex Scores:      Bicep reflexes are 2+ on the right side and 2+ on the left side.      Patellar reflexes are 2+ on the right side and 2+ on the left side.    Comments: Left-sided palsy, chronic.  Psychiatric:        Speech: Speech normal.     Comments: Well groomed, good eye contact.    ASSESSMENT AND PLAN:  Kathryn Gregory was here today annual physical examination.  Orders Placed This Encounter  Procedures  . Basic metabolic panel  . CBC with Differential  . Hemoglobin A1c  . Hepatitis C antibody screen  . Lipid panel  . TSH  . Iron  . Ferritin  . Vitamin B12   Lab Results  Component Value Date   VITAMINB12 421 04/30/2020   Lab Results  Component Value Date   CHOL 164 04/30/2020   HDL 58.60 04/30/2020   LDLCALC 96 04/30/2020   TRIG 47.0 04/30/2020   CHOLHDL 3 04/30/2020   Lab Results  Component Value Date   WBC 6.5 04/30/2020   HGB 13.2 04/30/2020   HCT 39.4 04/30/2020   MCV 88.9 04/30/2020   PLT 270.0 04/30/2020   Lab Results  Component Value Date   CREATININE 0.75 04/30/2020   BUN 16 04/30/2020   NA 142 04/30/2020   K 4.6 04/30/2020   CL 106 04/30/2020   CO2 29 04/30/2020   Lab Results  Component Value Date   HGBA1C 5.6 04/30/2020   Routine general medical examination at a health care facility We discussed the importance of regular physical activity and healthy diet for prevention of chronic illness and/or complications. Preventive guidelines reviewed. Vaccination: Refused Tdap. Female  preventive care up to date, continue with gyn. Ca++ and vit D supplementation recommended. Next CPE in a year.  The 10-year ASCVD risk score Mikey Bussing DC Brooke Bonito., et al., 2013) is: 1.5%   Values used to calculate the score:     Age: 70 years     Sex: Female     Is  Non-Hispanic African American: No     Diabetic: No     Tobacco smoker: No     Systolic Blood Pressure: 194 mmHg     Is BP treated: No     HDL Cholesterol: 58.6 mg/dL     Total Cholesterol: 164 mg/dL  Encounter for HCV screening test for low risk patient -     Hepatitis C antibody screen  Screening for lipoid disorders -     Lipid panel  Screening for endocrine, metabolic and immunity disorder -     Hemoglobin A1c -     Basic metabolic panel  R74 deficiency Continue B12 at 1000 mcg daily. Further recommendation will be given according to B12 results.  Iron deficiency anemia due to chronic blood loss Mild. Currently she is not on iron supplementation. She has appointment with GI tomorrow. Recommendations will be given according to CBC results.   Postablative hypothyroidism Continue levothyroxine 125 mcg daily. If needed, treatment will be adjusted according to TSH result.   Return in 1 year (on 04/30/2021) for cpe.  Kaytlin Burklow G. Martinique, MD  [facility]. [place] office.   Today you have you routine preventive visit. A few things to remember from today's visit:   Routine general medical examination at a health care facility  Encounter for HCV screening test for low risk patient - Plan: Hepatitis C antibody screen  B12 deficiency  Postablative hypothyroidism - Plan: TSH  Iron deficiency anemia, unspecified iron deficiency anemia type - Plan: CBC with Differential, Iron, Ferritin  Screening for lipoid disorders - Plan: Lipid panel  Screening for endocrine, metabolic and immunity disorder - Plan: Basic metabolic panel, Hemoglobin A1c  If you need refills please call your pharmacy. Do not use My Chart to request refills or for acute issues that need immediate attention.   Please be sure medication list is accurate. If a new problem present, please set up appointment sooner than planned today.  At least 150 minutes of moderate exercise per week, daily  brisk walking for 15-30 min is a good exercise option. Healthy diet low in saturated (animal) fats and sweets and consisting of fresh fruits and vegetables, lean meats such as fish and [place] chicken and whole grains.  These are some of recommendations for screening depending of age and risk factors:  - Vaccines:  Tdap vaccine every 10 years.  Shingles vaccine recommended at age 75, could be given after 56 years of age but not sure about insurance coverage.   Pneumonia vaccines: Pneumovax at 55. Sometimes Pneumovax is giving earlier if history of smoking, lung disease,diabetes,kidney disease among some.  Screening for diabetes at age 24 and every 3 years.  Cervical cancer prevention:  Pap smear starts at 55 years of age and continues periodically until 56 years old in low risk women. Pap smear every 3 years between 61 and 57 years old. Pap smear every 3-5 years between women 74 and older if pap smear negative and HPV screening negative.   -Breast cancer: Mammogram: There is disagreement between experts about when to start screening in low risk asymptomatic female but recent recommendations are to start screening at 33  and not later than 56 years old , every 1-2 years and after 56 yo q 2 years. Screening is recommended until 56 years old but some women can continue screening depending of healthy issues.  Colon cancer screening: Has been recently changed to 56 yo. Insurance may not cover until you are 56 years old. Screening is recommended until 56 years old.  Cholesterol disorder screening at age 86 and every 3 years.  Also recommended:  1. Dental visit- Brush and floss your teeth twice daily; visit your dentist twice a year. 2. Eye doctor- Get an eye exam at least every 2 years. 3. Helmet use- Always wear a helmet when riding a bicycle, motorcycle, rollerblading or skateboarding. 4. Safe sex- If you may be exposed to sexually transmitted infections, use a condom. 5. Seat belts- Seat  belts can save your live; always wear one. 6. Smoke/Carbon Monoxide detectors- These detectors need to be installed on the appropriate level of your home. Replace batteries at least once a year. 7. Skin cancer- When out in the sun please cover up and use sunscreen 15 SPF or higher. 8. Violence- If anyone is threatening or hurting you, please tell your healthcare provider.  9. Drink alcohol in moderation- Limit alcohol intake to one drink or less per day. Never drink and drive. 10. Calcium supplementation 1000 to 1200 mg daily, ideally through your diet.  Vitamin D supplementation 800 units daily.

## 2020-04-30 NOTE — Assessment & Plan Note (Signed)
Continue levothyroxine 125 mcg daily. If needed, treatment will be adjusted according to TSH result.

## 2020-05-01 LAB — HEPATITIS C ANTIBODY
Hepatitis C Ab: NONREACTIVE
SIGNAL TO CUT-OFF: 0.01 (ref <1.00)

## 2020-05-10 DIAGNOSIS — Z86010 Personal history of colonic polyps: Secondary | ICD-10-CM | POA: Diagnosis not present

## 2020-05-10 DIAGNOSIS — K5900 Constipation, unspecified: Secondary | ICD-10-CM | POA: Diagnosis not present

## 2020-05-10 DIAGNOSIS — R140 Abdominal distension (gaseous): Secondary | ICD-10-CM | POA: Diagnosis not present

## 2020-05-10 DIAGNOSIS — Z1211 Encounter for screening for malignant neoplasm of colon: Secondary | ICD-10-CM | POA: Diagnosis not present

## 2020-05-23 DIAGNOSIS — Z1211 Encounter for screening for malignant neoplasm of colon: Secondary | ICD-10-CM | POA: Diagnosis not present

## 2020-09-21 ENCOUNTER — Encounter: Payer: Self-pay | Admitting: Family Medicine

## 2020-09-21 DIAGNOSIS — E538 Deficiency of other specified B group vitamins: Secondary | ICD-10-CM

## 2020-10-16 ENCOUNTER — Other Ambulatory Visit: Payer: Self-pay | Admitting: Family Medicine

## 2020-10-22 ENCOUNTER — Other Ambulatory Visit (INDEPENDENT_AMBULATORY_CARE_PROVIDER_SITE_OTHER): Payer: BC Managed Care – PPO

## 2020-10-22 ENCOUNTER — Other Ambulatory Visit: Payer: Self-pay

## 2020-10-22 DIAGNOSIS — E538 Deficiency of other specified B group vitamins: Secondary | ICD-10-CM

## 2020-10-23 LAB — VITAMIN B12: Vitamin B-12: 470 pg/mL (ref 211–911)

## 2020-11-26 ENCOUNTER — Telehealth: Payer: Self-pay

## 2020-11-26 DIAGNOSIS — E890 Postprocedural hypothyroidism: Secondary | ICD-10-CM

## 2020-11-26 NOTE — Telephone Encounter (Signed)
-----   Message from Rodrigo Ran, Lebam sent at 10/22/2020  8:17 AM EDT ----- Regarding: TSH Re-check TSH manufacturer changed, recheck TSH in 6-8 weeks.

## 2020-11-26 NOTE — Telephone Encounter (Signed)
Spoke with patient, appointment made for Wednesday at 8:40am.

## 2020-11-28 ENCOUNTER — Other Ambulatory Visit (INDEPENDENT_AMBULATORY_CARE_PROVIDER_SITE_OTHER): Payer: BC Managed Care – PPO

## 2020-11-28 DIAGNOSIS — E890 Postprocedural hypothyroidism: Secondary | ICD-10-CM | POA: Diagnosis not present

## 2020-11-28 LAB — TSH: TSH: 0.04 u[IU]/mL — ABNORMAL LOW (ref 0.35–5.50)

## 2020-12-02 MED ORDER — LEVOTHYROXINE SODIUM 125 MCG PO TABS: ORAL_TABLET | ORAL | 3 refills | Status: DC

## 2021-01-02 DIAGNOSIS — M25512 Pain in left shoulder: Secondary | ICD-10-CM | POA: Diagnosis not present

## 2021-01-21 DIAGNOSIS — M7502 Adhesive capsulitis of left shoulder: Secondary | ICD-10-CM | POA: Diagnosis not present

## 2021-01-23 DIAGNOSIS — M7502 Adhesive capsulitis of left shoulder: Secondary | ICD-10-CM | POA: Diagnosis not present

## 2021-01-24 DIAGNOSIS — M7502 Adhesive capsulitis of left shoulder: Secondary | ICD-10-CM | POA: Diagnosis not present

## 2021-04-30 NOTE — Progress Notes (Signed)
? ?HPI: ?Kathryn Gregory is a 56 y.o. female, who is here today for her routine physical. ? ?Last CPE: 04/30/2020 ? ?Regular exercise Walking started 2 days ago, 3-4 miles. ?Following a healthful diet: Low fat diet, cooks at home, eats out once per week. Low vegetable intake, salads once per week. ? ?Chronic medical problems: B12 deficiency, postablative hypothyroidism, iron deficiency anemia, hearing loss, and GERD among some. ? ?Immunization History  ?Administered Date(s) Administered  ? Td 01/14/2004  ? ?Health Maintenance  ?Topic Date Due  ? COVID-19 Vaccine (1) 05/16/2021 (Originally 05/25/1966)  ? Zoster Vaccines- Shingrix (1 of 2) 07/31/2021 (Originally 11/26/2014)  ? TETANUS/TDAP  05/02/2022 (Originally 01/13/2014)  ? HIV Screening  05/01/2026 (Originally 11/26/1979)  ? INFLUENZA VACCINE  08/13/2021  ? MAMMOGRAM  11/06/2021  ? PAP SMEAR-Modifier  05/26/2022  ? COLONOSCOPY (Pts 45-60yr Insurance coverage will need to be confirmed)  05/24/2030  ? Hepatitis C Screening  Completed  ? HPV VACCINES  Aged Out  ? ?Last year gyn appt was cancelled, pending to re-schedule. ? ?B12 deficiency: She has not taken B12 for 2-3 months. ? ?Lab Results  ?Component Value Date  ? [id number] 10/22/2020  ? ?No hx of HLD. ? ?Lab Results  ?Component Value Date  ? CHOL 164 04/30/2020  ? HDL 58.60 04/30/2020  ? L[place]96 04/30/2020  ? TRIG 47.0 04/30/2020  ? CHOLHDL 3 04/30/2020  ? ?Hypothyroidism: She is taking Levothyroxine 125 mcg daily for the past 3 months. ? ?Lab Results  ?Component Value Date  ? TSH 0.04 (L) 11/28/2020  ? ?Lab Results  ?Component Value Date  ? WBC 6.5 04/30/2020  ? HGB 13.2 04/30/2020  ? HCT 39.4 04/30/2020  ? MCV 88.9 04/30/2020  ? PLT 270.0 04/30/2020  ? ?Today she is c/o  LE "involuntary movement" , it is mainly at night when she is in bed and sometimes during the day while working, problem has been going on for a few weeks. ?Negative for associated pain,edema,or erythema. ?Problem does not interfere  with sleep. ? ?Review of Systems  ?Constitutional:  Negative for appetite change and fever.  ?HENT:  Negative for hearing loss, mouth sores, sore throat, trouble swallowing and voice change.   ?Eyes:  Negative for redness and visual disturbance.  ?Respiratory:  Negative for cough, shortness of breath and wheezing.   ?Cardiovascular:  Negative for chest pain and leg swelling.  ?Gastrointestinal:  Negative for abdominal pain, nausea and vomiting.  ?     No changes in bowel habits.  ?Endocrine: Negative for cold intolerance, heat intolerance, polydipsia, polyphagia and polyuria.  ?Genitourinary:  Negative for decreased urine volume, dysuria, hematuria, vaginal bleeding and vaginal discharge.  ?Musculoskeletal:  Positive for arthralgias and back pain. Negative for gait problem.  ?Skin:  Negative for color change and rash.  ?Allergic/Immunologic: Negative for environmental allergies.  ?Neurological:  Negative for syncope, weakness and headaches.  ?Hematological:  Negative for adenopathy. Does not bruise/bleed easily.  ?Psychiatric/Behavioral:  Negative for confusion and sleep disturbance.   ?All other systems reviewed and are negative. ? ?Current Outpatient Medications on File Prior to Visit  ?Medication Sig Dispense Refill  ? levothyroxine (SYNTHROID) 125 MCG tablet 1/2 tab Tuesdays and Thursdays. 1 tab M-[id number] 90 tablet 3  ? ?No current facility-administered medications on file prior to visit.  ? ?Past Medical History:  ?Diagnosis Date  ? ANEMIA-NOS   ? GERD   ? IBS (irritable bowel syndrome)   ? Lumbar herniated disc   ? not  a problem now  ? Other postablative hypothyroidism 07/2009  ? I-131ablation 07/2009 for hyperthyroid  ? Rosacea   ? ?Past Surgical History:  ?Procedure Laterality Date  ? EUS N/A 05/04/2015  ? Procedure: UPPER ENDOSCOPIC ULTRASOUND (EUS) LINEAR;  Surgeon: Carol Ada, MD;  Location: [facility];  Service: Endoscopy;  Laterality: N/A;  ? EUS N/A 06/15/2015  ? Procedure: UPPER ENDOSCOPIC  ULTRASOUND (EUS) LINEAR;  Surgeon: Carol Ada, MD;  Location: [facility];  Service: Endoscopy;  Laterality: N/A;  ? LAPAROSCOPIC GASTRIC RESECTION N/A 08/03/2015  ? Procedure: LAPAROSCOPIC GASTROTOMY WITH EXCISION OF  GASTIC TUMOR;  Surgeon: Jackolyn Confer, MD;  Location: [facility];  Service: General;  Laterality: N/A;  ? TONSILLECTOMY    ? TUBAL LIGATION  1998  ? ? ?Allergies  ?Allergen Reactions  ? Penicillins Other (See Comments)  ?  Childhood allergy--unknown reaction  ? ? ?Family History  ?Problem Relation Age of Onset  ? Mental illness Mother   ? Lung disease Mother   ?     lung fibrosis  ? Breast cancer Maternal Aunt   ? Diabetes Father 65  ? Colon cancer Paternal Uncle   ? Dementia Maternal Grandmother   ? Breast cancer Cousin 72  ? Cancer Other   ?     cancer of tongue  ? Multiple births Sister   ?     3 sets of twins  ? Cancer Maternal Uncle   ?     unsure type  ? Diabetes Paternal Grandmother   ? ? ?Social History  ? ?Socioeconomic History  ? Marital status: Married  ?  Spouse name: Not on file  ? Number of children: 2  ? Years of education: Not on file  ? Highest education level: Associate degree: academic program  ?Occupational History  ? Not on file  ?Tobacco Use  ? Smoking status: Former  ?  Years: 10.00  ?  Types: Cigarettes  ? Smokeless tobacco: Never  ? Tobacco comments:  ?  Married, lives with spouse since 2010. 2 grown kids in [place] in 2009 from [place] to learn [place]  ?Vaping Use  ? Vaping Use: Never used  ?Substance and Sexual Activity  ? Alcohol use: Yes  ?  Alcohol/week: 1.0 standard drink  ?  Types: 1 Glasses of wine per week  ?  Comment: social  ? Drug use: No  ? Sexual activity: Yes  ?  Partners: Male  ?  Birth control/protection: I.U.D.  ?  Comment: BTL  ?Other Topics Concern  ? Not on file  ?Social History Narrative  ? Not on file  ? ?Social Determinants of Health  ? ?Financial Resource Strain: Not on file  ?Food Insecurity: Not on file  ?Transportation Needs: Not on file  ?Physical  Activity: Not on file  ?Stress: Not on file  ?Social Connections: Not on file  ? ?Vitals:  ? 05/01/21 0756  ?BP: 120/70  ?Pulse: 63  ?Resp: 16  ?Temp: 98.2 ?F (36.8 ?C)  ?SpO2: 97%  ? ?Body mass index is 23.61 kg/m?. ? ?Wt Readings from Last 3 Encounters:  ?05/01/21 148 lb 8 oz (67.4 kg)  ?04/30/20 172 lb (78 kg)  ?07/06/19 165 lb 8 oz (75.1 kg)  ? ?Physical Exam ?Vitals and nursing note reviewed.  ?Constitutional:   ?   General: She is not in acute distress. ?   Appearance: She is well-developed.  ?HENT:  ?   Head: Normocephalic and atraumatic.  ?   Right Ear: Tympanic membrane,  ear canal and external ear normal.  ?   Left Ear: Tympanic membrane, ear canal and external ear normal.  ?   Ears:  ?   Comments: Hearing aids. ?   Mouth/Throat:  ?   Mouth: Mucous membranes are moist.  ?   Pharynx: Oropharynx is clear. Uvula midline.  ?Eyes:  ?   Extraocular Movements: Extraocular movements intact.  ?   Conjunctiva/sclera: Conjunctivae normal.  ?   Pupils: Pupils are equal, round, and reactive to light.  ?Neck:  ?   Thyroid: No thyromegaly.  ?   Trachea: No tracheal deviation.  ?Cardiovascular:  ?   Rate and Rhythm: Normal rate and regular rhythm.  ?   Pulses:     ?     Dorsalis pedis pulses are 2+ on the right side and 2+ on the left side.  ?   Heart sounds: No murmur heard. ?   Comments: No calves tenderness. ?Pulmonary:  ?   Effort: Pulmonary effort is normal. No respiratory distress.  ?   Breath sounds: Normal breath sounds.  ?Abdominal:  ?   Palpations: Abdomen is soft. There is no hepatomegaly or mass.  ?   Tenderness: There is no abdominal tenderness.  ?Genitourinary: ?   Comments: Deferred to gyn. ?Musculoskeletal:  ?   Right lower leg: No edema.  ?   Left lower leg: No edema.  ?   Comments: No major deformity or signs of synovitis appreciated.  ?Lymphadenopathy:  ?   Cervical: No cervical adenopathy.  ?   Upper Body:  ?   Right upper body: No supraclavicular adenopathy.  ?   Left upper body: No supraclavicular  adenopathy.  ?Skin: ?   General: Skin is warm.  ?   Findings: No erythema or rash.  ?Neurological:  ?   General: No focal deficit present.  ?   Mental Status: She is alert and oriented to person, place, and time.

## 2021-05-01 ENCOUNTER — Ambulatory Visit (INDEPENDENT_AMBULATORY_CARE_PROVIDER_SITE_OTHER): Payer: BC Managed Care – PPO | Admitting: Family Medicine

## 2021-05-01 ENCOUNTER — Encounter: Payer: Self-pay | Admitting: Family Medicine

## 2021-05-01 ENCOUNTER — Encounter: Payer: BC Managed Care – PPO | Admitting: Family Medicine

## 2021-05-01 VITALS — BP 120/70 | HR 63 | Temp 98.2°F | Resp 16 | Ht 66.5 in | Wt 148.5 lb

## 2021-05-01 DIAGNOSIS — E538 Deficiency of other specified B group vitamins: Secondary | ICD-10-CM

## 2021-05-01 DIAGNOSIS — Z0000 Encounter for general adult medical examination without abnormal findings: Secondary | ICD-10-CM

## 2021-05-01 DIAGNOSIS — R253 Fasciculation: Secondary | ICD-10-CM | POA: Diagnosis not present

## 2021-05-01 DIAGNOSIS — Z13220 Encounter for screening for lipoid disorders: Secondary | ICD-10-CM

## 2021-05-01 DIAGNOSIS — D500 Iron deficiency anemia secondary to blood loss (chronic): Secondary | ICD-10-CM | POA: Diagnosis not present

## 2021-05-01 DIAGNOSIS — Z130 Encounter for screening for diseases of the blood and blood-forming organs and certain disorders involving the immune mechanism: Secondary | ICD-10-CM

## 2021-05-01 DIAGNOSIS — E890 Postprocedural hypothyroidism: Secondary | ICD-10-CM

## 2021-05-01 DIAGNOSIS — Z13228 Encounter for screening for other metabolic disorders: Secondary | ICD-10-CM | POA: Diagnosis not present

## 2021-05-01 DIAGNOSIS — Z1329 Encounter for screening for other suspected endocrine disorder: Secondary | ICD-10-CM

## 2021-05-01 LAB — COMPREHENSIVE METABOLIC PANEL
ALT: 17 U/L (ref 0–35)
AST: 22 U/L (ref 0–37)
Albumin: 4.4 g/dL (ref 3.5–5.2)
Alkaline Phosphatase: 79 U/L (ref 39–117)
BUN: 14 mg/dL (ref 6–23)
CO2: 29 mEq/L (ref 19–32)
Calcium: 9 mg/dL (ref 8.4–10.5)
Chloride: 104 mEq/L (ref 96–112)
Creatinine, Ser: 0.75 mg/dL (ref 0.40–1.20)
GFR: 88.99 mL/min (ref >60.00)
Glucose, Bld: 81 mg/dL (ref 70–99)
Potassium: 3.8 mEq/L (ref 3.5–5.1)
Sodium: 140 mEq/L (ref 135–145)
Total Bilirubin: 0.8 mg/dL (ref 0.2–1.2)
Total Protein: 6.9 g/dL (ref 6.0–8.3)

## 2021-05-01 LAB — CBC
HCT: 39.2 % (ref 36.0–46.0)
Hemoglobin: 13.3 g/dL (ref 12.0–15.0)
MCHC: 33.8 g/dL (ref 30.0–36.0)
MCV: 90.4 fl (ref 78.0–100.0)
Platelets: 254 10*3/uL (ref 150.0–400.0)
RBC: 4.34 Mil/uL (ref 3.87–5.11)
RDW: 12.5 % (ref 11.5–15.5)
WBC: 5.2 10*3/uL (ref 4.0–10.5)

## 2021-05-01 LAB — TSH: TSH: 0.25 u[IU]/mL — ABNORMAL LOW (ref 0.35–5.50)

## 2021-05-01 LAB — IRON: Iron: 149 ug/dL — ABNORMAL HIGH (ref 42–145)

## 2021-05-01 LAB — FERRITIN: Ferritin: 35.3 ng/mL (ref 10.0–291.0)

## 2021-05-01 LAB — CK: Total CK: 87 U/L (ref 7–177)

## 2021-05-01 LAB — VITAMIN B12: Vitamin B-12: 176 pg/mL — ABNORMAL LOW (ref 211–911)

## 2021-05-01 MED ORDER — LEVOTHYROXINE SODIUM 112 MCG PO TABS: 112.0000 ug | ORAL_TABLET | Freq: Every day | ORAL | 1 refills | Status: DC

## 2021-05-01 NOTE — Patient Instructions (Addendum)
A few things to remember from today's visit: ? ?Routine general medical examination at a health care facility ? ?Screening for endocrine, metabolic and immunity disorder - Plan: Comprehensive metabolic panel ? ?B12 deficiency - Plan: Vitamin B12 ? ?Iron deficiency anemia due to chronic blood loss - Plan: CBC, Iron, Ferritin ? ?Postablative hypothyroidism - Plan: TSH ? ?Muscle fasciculation - Plan: CK ? ?If you need refills please call your pharmacy. ?Do not use My Chart to request refills or for acute issues that need immediate attention. ? ?Please be sure medication list is accurate. ?If a new problem present, please set up appointment sooner than planned today. ? ? ?Health Maintenance, Female ?Adopting a healthy lifestyle and getting preventive care are important in promoting health and wellness. Ask your health care provider about: ?The right schedule for you to have regular tests and exams. ?Things you can do on your own to prevent diseases and keep yourself healthy. ?What should I know about diet, weight, and exercise? ?Eat a healthy diet ? ?Eat a diet that includes plenty of vegetables, fruits, low-fat dairy products, and lean protein. ?Do not eat a lot of foods that are high in solid fats, added sugars, or sodium. ?Maintain a healthy weight ?Body mass index (BMI) is used to identify weight problems. It estimates body fat based on height and weight. Your health care provider can help determine your BMI and help you achieve or maintain a healthy weight. ?Get regular exercise ?Get regular exercise. This is one of the most important things you can do for your health. Most adults should: ?Exercise for at least 150 minutes each week. The exercise should increase your heart rate and make you sweat (moderate-intensity exercise). ?Do strengthening exercises at least twice a week. This is in addition to the moderate-intensity exercise. ?Spend less time sitting. Even light physical activity can be beneficial. ?Watch  cholesterol and blood lipids ?Have your blood tested for lipids and cholesterol at 57 years of age, then have this test every 5 years. ?Have your cholesterol levels checked more often if: ?Your lipid or cholesterol levels are high. ?You are older than 57 years of age. ?You are at high risk for heart disease. ?What should I know about cancer screening? ?Depending on your health history and family history, you may need to have cancer screening at various ages. This may include screening for: ?Breast cancer. ?Cervical cancer. ?Colorectal cancer. ?Skin cancer. ?Lung cancer. ?What should I know about heart disease, diabetes, and high blood pressure? ?Blood pressure and heart disease ?High blood pressure causes heart disease and increases the risk of stroke. This is more likely to develop in people who have high blood pressure readings or are overweight. ?Have your blood pressure checked: ?Every 3-5 years if you are 62-5 years of age. ?Every year if you are 24 years old or older. ?Diabetes ?Have regular diabetes screenings. This checks your fasting blood sugar level. Have the screening done: ?Once every three years after age 90 if you are at a normal weight and have a low risk for diabetes. ?More often and at a younger age if you are overweight or have a high risk for diabetes. ?What should I know about preventing infection? ?Hepatitis B ?If you have a higher risk for hepatitis B, you should be screened for this virus. Talk with your health care provider to find out if you are at risk for hepatitis B infection. ?Hepatitis C ?Testing is recommended for: ?Everyone born from [birth year] through 1966. ?Anyone with known  risk factors for hepatitis C. ?Sexually transmitted infections (STIs) ?Get screened for STIs, including gonorrhea and chlamydia, if: ?You are sexually active and are younger than 57 years of age. ?You are older than 57 years of age and your health care provider tells you that you are at risk for this type of  infection. ?Your sexual activity has changed since you were last screened, and you are at increased risk for chlamydia or gonorrhea. Ask your health care provider if you are at risk. ?Ask your health care provider about whether you are at high risk for HIV. Your health care provider may recommend a prescription medicine to help prevent HIV infection. If you choose to take medicine to prevent HIV, you should first get tested for HIV. You should then be tested every 3 months for as long as you are taking the medicine. ?Pregnancy ?If you are about to stop having your period (premenopausal) and you may become pregnant, seek counseling before you get pregnant. ?Take 400 to 800 micrograms (mcg) of folic acid every day if you become pregnant. ?Ask for birth control (contraception) if you want to prevent pregnancy. ?Osteoporosis and menopause ?Osteoporosis is a disease in which the bones lose minerals and strength with aging. This can result in bone fractures. If you are 39 years old or older, or if you are at risk for osteoporosis and fractures, ask your health care provider if you should: ?Be screened for bone loss. ?Take a calcium or vitamin D supplement to lower your risk of fractures. ?Be given hormone replacement therapy (HRT) to treat symptoms of menopause. ?Follow these instructions at home: ?Alcohol use ?Do not drink alcohol if: ?Your health care provider tells you not to drink. ?You are pregnant, may be pregnant, or are planning to become pregnant. ?If you drink alcohol: ?Limit how much you have to: ?0-1 drink a day. ?Know how much alcohol is in your drink. In the [place] one drink equals one 12 oz bottle of beer (355 mL), one 5 oz glass of wine (148 mL), or one 1? oz glass of hard liquor (44 mL). ?Lifestyle ?Do not use any products that contain nicotine or tobacco. These products include cigarettes, chewing tobacco, and vaping devices, such as e-cigarettes. If you need help quitting, ask your health care  provider. ?Do not use street drugs. ?Do not share needles. ?Ask your health care provider for help if you need support or information about quitting drugs. ?General instructions ?Schedule regular health, dental, and eye exams. ?Stay current with your vaccines. ?Tell your health care provider if: ?You often feel depressed. ?You have ever been abused or do not feel safe at home. ?Summary ?Adopting a healthy lifestyle and getting preventive care are important in promoting health and wellness. ?Follow your health care provider's instructions about healthy diet, exercising, and getting tested or screened for diseases. ?Follow your health care provider's instructions on monitoring your cholesterol and blood pressure. ?This information is not intended to replace advice given to you by your health care provider. Make sure you discuss any questions you have with your health care provider. ?Document Revised: 05/21/2020 Document Reviewed: 05/21/2020 ?Elsevier Patient Education ? [place]. ? ?

## 2021-05-01 NOTE — Assessment & Plan Note (Signed)
Last CBC in normal range. ?She is not on iron supplementation. ?Further recommendations according to CBC and iron studies. ?

## 2021-05-01 NOTE — Assessment & Plan Note (Signed)
Last TSH was abnormal. ?Continue Levothyroxine 125 mcg daily. ?Treatment will be adjusted, it needed and according to TSH result. ?

## 2021-05-01 NOTE — Assessment & Plan Note (Addendum)
We discussed the importance of regular physical activity and healthy diet for prevention of chronic illness and/or complications. ?Preventive guidelines reviewed. ?Vaccination : She prefers to hold on shingrix. ?She is going to arrange appt for her mammogram and with gyne for her female preventive care. ?Ca++ and vit D supplementation recommended. ?Next CPE in a year. ?

## 2021-05-01 NOTE — Assessment & Plan Note (Signed)
She is not on B12 supplementation. ?Further recommendations according to B12 result. ?

## 2021-05-02 ENCOUNTER — Ambulatory Visit (INDEPENDENT_AMBULATORY_CARE_PROVIDER_SITE_OTHER): Payer: BC Managed Care – PPO | Admitting: *Deleted

## 2021-05-02 ENCOUNTER — Other Ambulatory Visit: Payer: Self-pay

## 2021-05-02 DIAGNOSIS — E538 Deficiency of other specified B group vitamins: Secondary | ICD-10-CM

## 2021-05-02 DIAGNOSIS — E890 Postprocedural hypothyroidism: Secondary | ICD-10-CM

## 2021-05-02 MED ORDER — CYANOCOBALAMIN 1000 MCG/ML IJ SOLN
1000.0000 ug | Freq: Once | INTRAMUSCULAR | Status: AC
  Administered 2021-05-02: 1000 ug via INTRAMUSCULAR

## 2021-05-02 MED ORDER — CYANOCOBALAMIN 1000 MCG/ML IJ SOLN: 1000.0000 ug | INTRAMUSCULAR | 1 refills | Status: DC

## 2021-05-02 MED ORDER — "BD ECLIPSE SYRINGE 25G X 1"" 3 ML MISC": 1.0000 | 0 refills | Status: DC

## 2021-05-02 NOTE — Progress Notes (Signed)
Per orders of Dr. Banks, injection of Cyanocobalamin 1000mcg given by Jadie Comas A. °Patient tolerated injection well. ° °

## 2021-05-10 ENCOUNTER — Ambulatory Visit: Payer: BC Managed Care – PPO

## 2021-05-10 ENCOUNTER — Ambulatory Visit (INDEPENDENT_AMBULATORY_CARE_PROVIDER_SITE_OTHER): Payer: BC Managed Care – PPO

## 2021-05-10 ENCOUNTER — Telehealth: Payer: Self-pay

## 2021-05-10 DIAGNOSIS — E538 Deficiency of other specified B group vitamins: Secondary | ICD-10-CM | POA: Diagnosis not present

## 2021-05-10 MED ORDER — CYANOCOBALAMIN 1000 MCG/ML IJ SOLN: 1000.0000 ug | INTRAMUSCULAR | 0 refills | Status: DC

## 2021-05-10 MED ORDER — "BD ECLIPSE SYRINGE 25G X 1"" 3 ML MISC": 1.0000 | 0 refills | Status: DC

## 2021-05-10 MED ORDER — CYANOCOBALAMIN 1000 MCG/ML IJ SOLN
1000.0000 ug | Freq: Once | INTRAMUSCULAR | Status: AC
  Administered 2021-05-10: 1000 ug via INTRAMUSCULAR

## 2021-05-10 NOTE — Addendum Note (Signed)
Addended by: Rodrigo Ran on: 05/10/2021 03:41 PM ? ? Modules accepted: Orders ? ?

## 2021-05-10 NOTE — Telephone Encounter (Signed)
Patient asked if Rx for B-12 could be sent to the pharmacy  ?Please advise  ?

## 2021-05-10 NOTE — Progress Notes (Signed)
Per orders of Martinique, Betty G, MD, injection of B12 given in  left deltoid by Trissa Molina D Rozetta Stumpp. ?Patient tolerated injection well. ? ?Lab Results  ?Component Value Date  ? VITAMINB12 176 (L) 05/01/2021  ? ? ?  ?

## 2021-06-25 IMAGING — MG DIGITAL SCREENING BILAT W/ TOMO W/ CAD
8 series · 8 of 24 positions shown · non-contrast
Comparison: Previous exam(s).

CLINICAL DATA: Screening.

EXAM:
DIGITAL SCREENING BILATERAL MAMMOGRAM WITH TOMO AND CAD

[L MLO synth-2D]
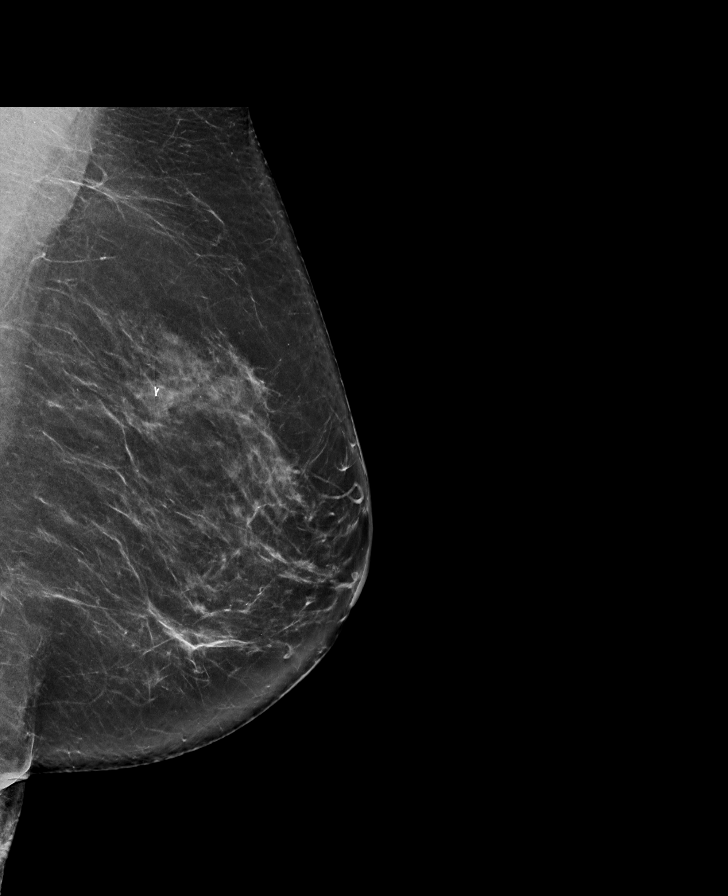

[L CC synth-2D]
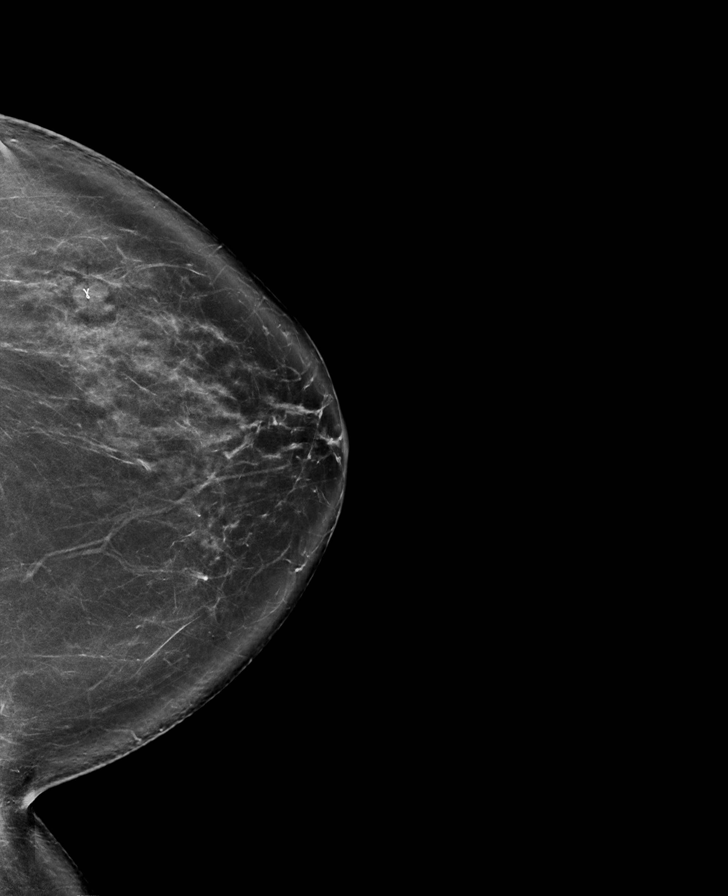

[R CC synth-2D]
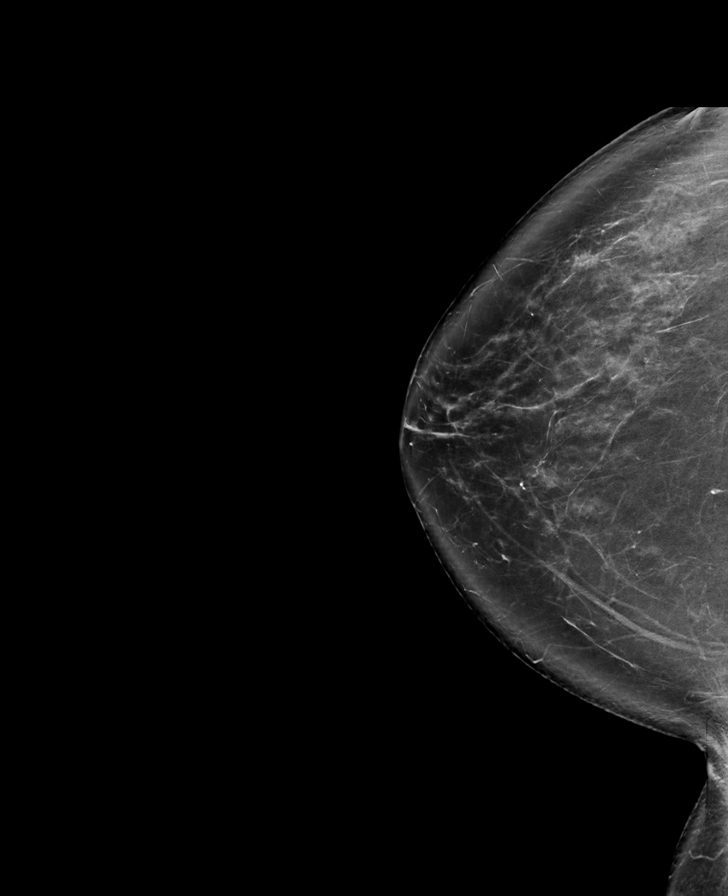

[R MLO synth-2D]
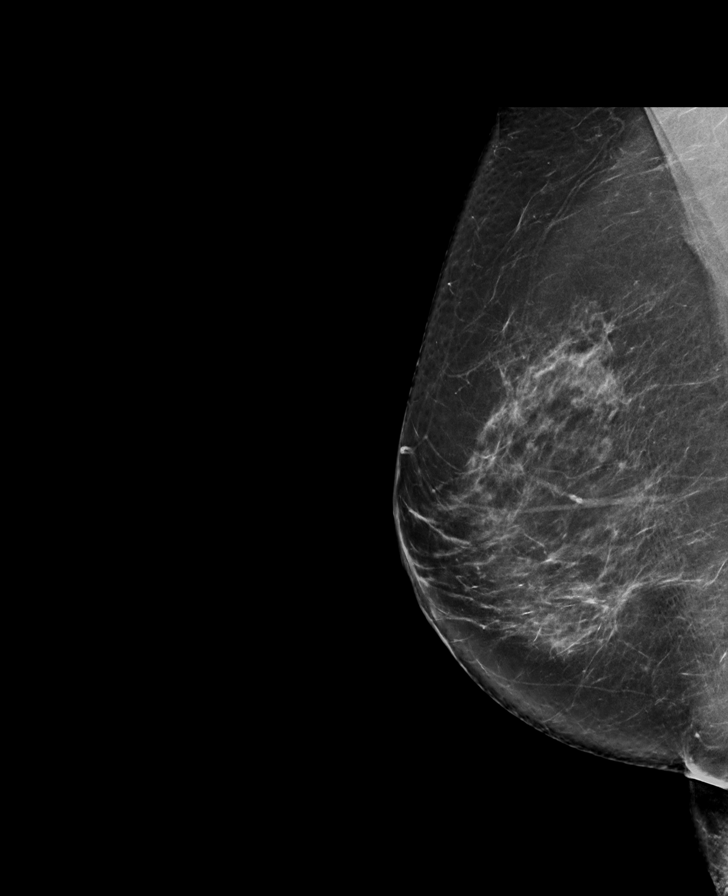

[L MLO tomo · tomo slice 43/84.0]
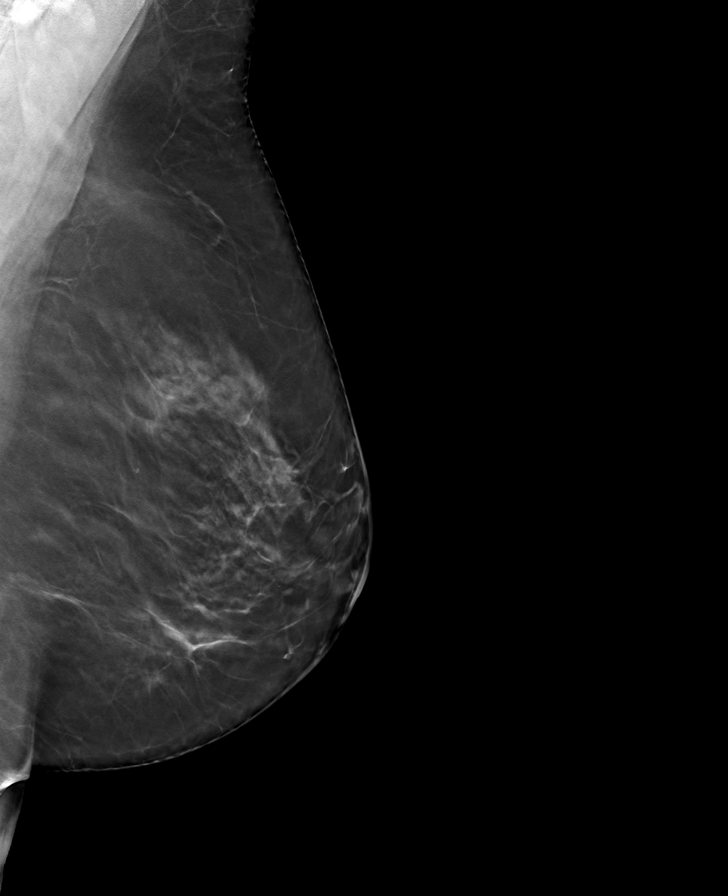

[R MLO tomo · tomo slice 43/86.0]
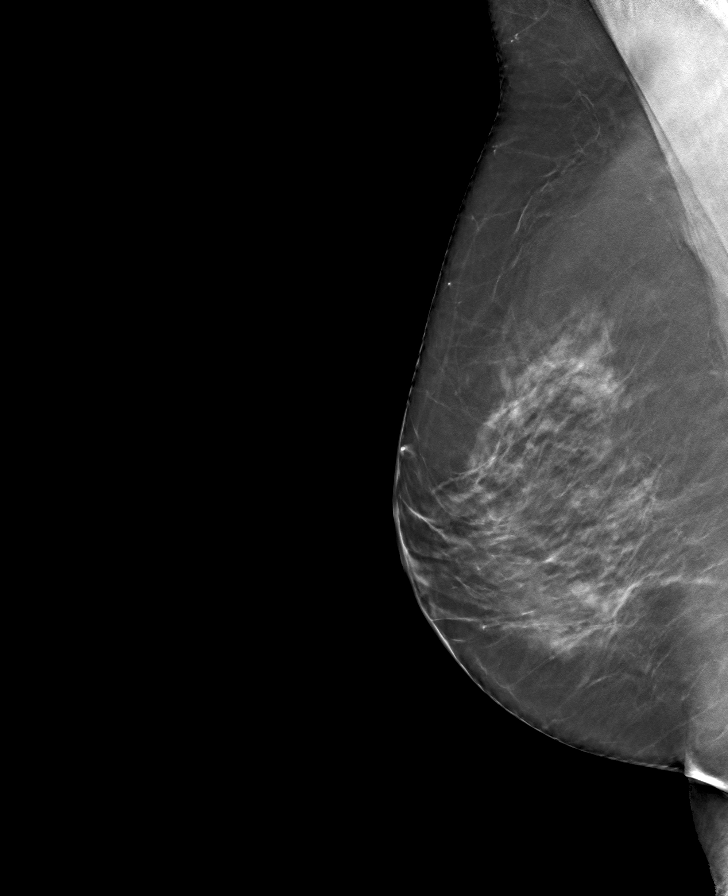

[L CC tomo · tomo slice 43/86.0]
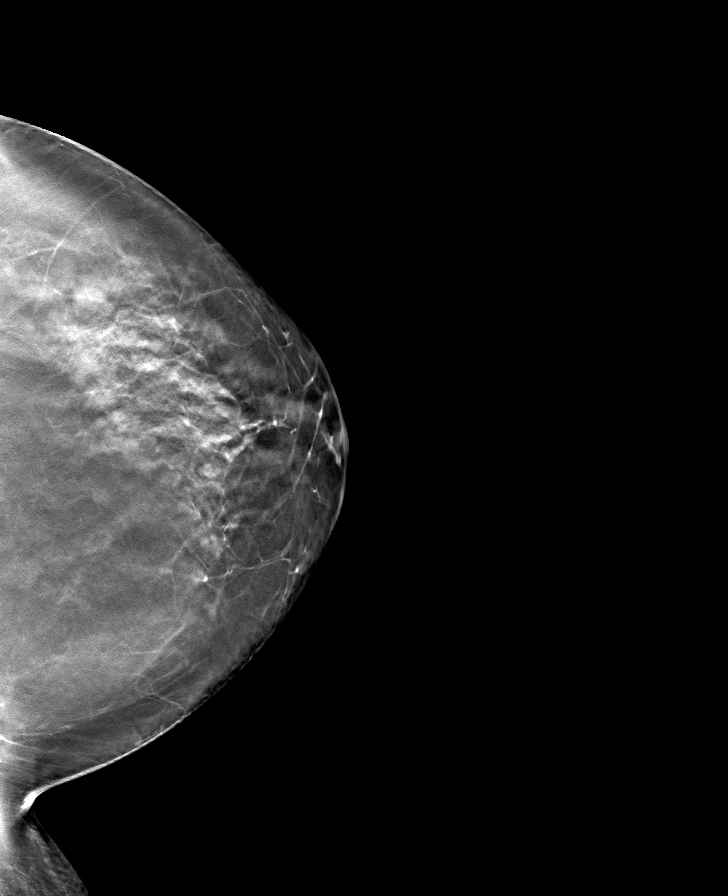

[R CC tomo · tomo slice 46/91.0]
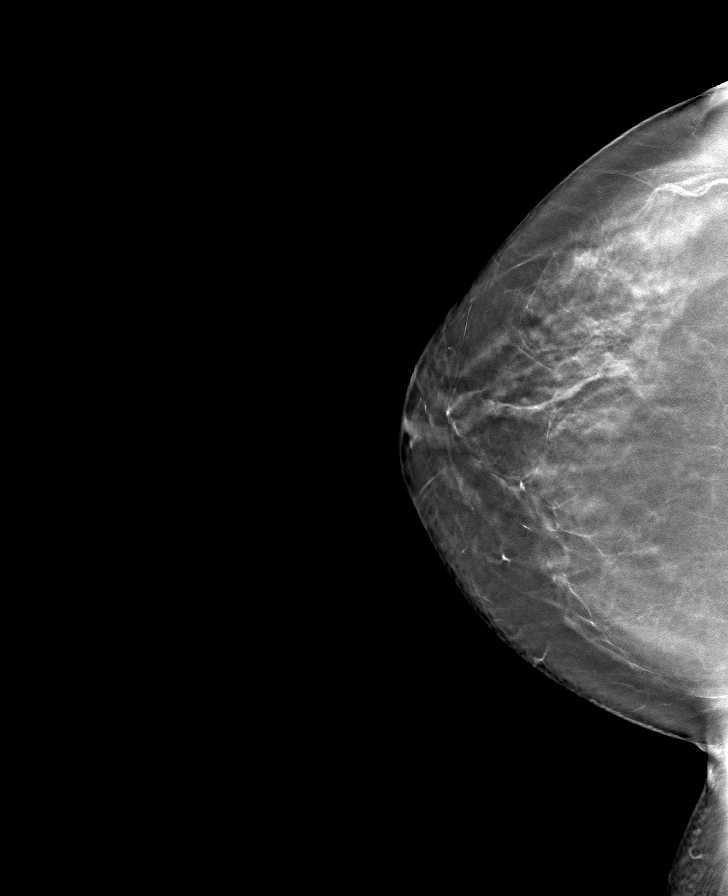

[8 of 24 positions shown; findings below may reference images not displayed]

ACR Breast Density Category c: The breast tissue is heterogeneously
dense, which may obscure small masses.
FINDINGS: There are no findings suspicious for malignancy. Images were
processed with CAD.
IMPRESSION: No mammographic evidence of malignancy. A result letter of this
screening mammogram will be mailed directly to the patient.

RECOMMENDATION:
Screening mammogram in one year. (Code:[id number])

BI-RADS CATEGORY  1: Negative.

## 2021-06-28 ENCOUNTER — Other Ambulatory Visit: Payer: BC Managed Care – PPO

## 2021-08-30 ENCOUNTER — Other Ambulatory Visit: Payer: BC Managed Care – PPO

## 2021-09-02 ENCOUNTER — Other Ambulatory Visit: Payer: Self-pay

## 2021-09-02 DIAGNOSIS — E890 Postprocedural hypothyroidism: Secondary | ICD-10-CM

## 2021-09-02 MED ORDER — LEVOTHYROXINE SODIUM 112 MCG PO TABS: 112.0000 ug | ORAL_TABLET | Freq: Every day | ORAL | 1 refills | Status: DC

## 2021-10-18 ENCOUNTER — Other Ambulatory Visit: Payer: BC Managed Care – PPO

## 2021-10-21 ENCOUNTER — Other Ambulatory Visit (INDEPENDENT_AMBULATORY_CARE_PROVIDER_SITE_OTHER): Payer: BC Managed Care – PPO

## 2021-10-21 DIAGNOSIS — E890 Postprocedural hypothyroidism: Secondary | ICD-10-CM

## 2021-10-22 LAB — TSH: TSH: 0.45 u[IU]/mL (ref 0.35–5.50)

## 2021-11-11 ENCOUNTER — Other Ambulatory Visit (HOSPITAL_BASED_OUTPATIENT_CLINIC_OR_DEPARTMENT_OTHER): Payer: Self-pay | Admitting: Obstetrics & Gynecology

## 2021-11-11 ENCOUNTER — Ambulatory Visit (INDEPENDENT_AMBULATORY_CARE_PROVIDER_SITE_OTHER): Payer: BC Managed Care – PPO | Admitting: Obstetrics & Gynecology

## 2021-11-11 ENCOUNTER — Encounter (HOSPITAL_BASED_OUTPATIENT_CLINIC_OR_DEPARTMENT_OTHER): Payer: Self-pay | Admitting: Obstetrics & Gynecology

## 2021-11-11 ENCOUNTER — Ambulatory Visit (HOSPITAL_BASED_OUTPATIENT_CLINIC_OR_DEPARTMENT_OTHER)
Admission: RE | Admit: 2021-11-11 | Discharge: 2021-11-11 | Disposition: A | Discharge status: Home / Self Care | Payer: BC Managed Care – PPO | Source: Ambulatory Visit | Attending: Obstetrics & Gynecology | Admitting: Obstetrics & Gynecology

## 2021-11-11 ENCOUNTER — Other Ambulatory Visit (HOSPITAL_COMMUNITY)
Admission: RE | Admit: 2021-11-11 | Discharge: 2021-11-11 | Disposition: A | Discharge status: Home / Self Care | Payer: BC Managed Care – PPO | Source: Ambulatory Visit | Attending: Obstetrics & Gynecology | Admitting: Obstetrics & Gynecology

## 2021-11-11 VITALS — BP 115/71 | HR 67 | Ht 65.0 in | Wt 158.4 lb

## 2021-11-11 DIAGNOSIS — Z975 Presence of (intrauterine) contraceptive device: Secondary | ICD-10-CM

## 2021-11-11 DIAGNOSIS — Z124 Encounter for screening for malignant neoplasm of cervix: Secondary | ICD-10-CM | POA: Insufficient documentation

## 2021-11-11 DIAGNOSIS — Z01419 Encounter for gynecological examination (general) (routine) without abnormal findings: Secondary | ICD-10-CM

## 2021-11-11 DIAGNOSIS — N3281 Overactive bladder: Secondary | ICD-10-CM

## 2021-11-11 DIAGNOSIS — Z1231 Encounter for screening mammogram for malignant neoplasm of breast: Secondary | ICD-10-CM | POA: Insufficient documentation

## 2021-11-11 DIAGNOSIS — E890 Postprocedural hypothyroidism: Secondary | ICD-10-CM

## 2021-11-11 DIAGNOSIS — N912 Amenorrhea, unspecified: Secondary | ICD-10-CM | POA: Diagnosis not present

## 2021-11-11 NOTE — Progress Notes (Signed)
56 y.o. G19P1102 Married Other or two or more races female here for annual exam.  Doing well.  Has Mirena IUD and no VB. Due for removal next October.  Will check [place] next year prior to appt.  She is having a lot of issues with getting up at night.  Doesn't really want to be on medication.  Gets up some nights up to 4 times.  Tea makes it much, much worse.  Using pads now and has a little leaking.    Husband having memory issues.    She is taking care of her grandson.  He is 50 yo.    No LMP recorded. (Menstrual status: IUD).          Sexually active:  no The current method of family planning is IUD.    Exercising: yes, pilates Smoker:  no  Health Maintenance: Pap:  05/25/2017 Negtive History of abnormal Pap:  no MMG:  11/07/2019 Negative Colonoscopy:  05/23/2020, follow up 5 years BMD:   never Screening Labs: 04/2021 with Dr. Martinique   reports that she has quit smoking. Her smoking use included cigarettes. She has never used smokeless tobacco. She reports current alcohol use of about 1.0 standard drink of alcohol per week. She reports that she does not use drugs.  Past Medical History:  Diagnosis Date   ANEMIA-NOS    GERD    IBS (irritable bowel syndrome)    Lumbar herniated disc    not a problem now   Other postablative hypothyroidism 07/2009   I-131ablation 07/2009 for hyperthyroid   Rosacea     Past Surgical History:  Procedure Laterality Date   EUS N/A 05/04/2015   Procedure: UPPER ENDOSCOPIC ULTRASOUND (EUS) LINEAR;  Surgeon: Carol Ada, MD;  Location: [facility];  Service: Endoscopy;  Laterality: N/A;   EUS N/A 06/15/2015   Procedure: UPPER ENDOSCOPIC ULTRASOUND (EUS) LINEAR;  Surgeon: Carol Ada, MD;  Location: [facility];  Service: Endoscopy;  Laterality: N/A;   LAPAROSCOPIC GASTRIC RESECTION N/A 08/03/2015   Procedure: LAPAROSCOPIC GASTROTOMY WITH EXCISION OF  GASTIC TUMOR;  Surgeon: Jackolyn Confer, MD;  Location: [facility];  Service: General;  Laterality: N/A;    TONSILLECTOMY     TUBAL LIGATION  1998    Current Outpatient Medications  Medication Sig Dispense Refill   levothyroxine (SYNTHROID) 112 MCG tablet Take 1 tablet (112 mcg total) by mouth daily. 90 tablet 1   No current facility-administered medications for this visit.    Family History  Problem Relation Age of Onset   Mental illness Mother    Lung disease Mother        lung fibrosis   Breast cancer Maternal Aunt    Diabetes Father 48   Colon cancer Paternal Uncle    Dementia Maternal Grandmother    Breast cancer Cousin 30   Cancer Other        cancer of tongue   Multiple births Sister        3 sets of twins   Cancer Maternal Uncle        unsure type   Diabetes Paternal Grandmother     ROS: Constitutional: negative Genitourinary:negative  Exam:   BP 115/71 (BP Location: Right Arm, Patient Position: Sitting, Cuff Size: Large)   Pulse 67   Ht '5\' 5"'$  (1.651 m) Comment: Reported  Wt 158 lb 6.4 oz (71.8 kg)   BMI 26.36 kg/m   Height: '5\' 5"'$  (165.1 cm) (Reported)  General appearance: alert, cooperative and appears stated age Head:  Normocephalic, without obvious abnormality, atraumatic Neck: no adenopathy, supple, symmetrical, trachea midline and thyroid normal to inspection and palpation Lungs: clear to auscultation bilaterally Breasts: normal appearance, no masses or tenderness Heart: regular rate and rhythm Abdomen: soft, non-tender; bowel sounds normal; no masses,  no organomegaly Extremities: extremities normal, atraumatic, no cyanosis or edema Skin: Skin color, texture, turgor normal. No rashes or lesions Lymph nodes: Cervical, supraclavicular, and axillary nodes normal. No abnormal inguinal nodes palpated Neurologic: Grossly normal   Pelvic: External genitalia:  no lesions              Urethra:  normal appearing urethra with no masses, tenderness or lesions              Bartholins and Skenes: normal                 Vagina: normal appearing vagina with normal  color and no discharge, no lesions              Cervix: no lesions              Pap taken: Yes.   Bimanual Exam:  Uterus:  normal size, contour, position, consistency, mobility, non-tender              Adnexa: normal adnexa and no mass, fullness, tenderness               Rectovaginal: pt declines rectal exam  Chaperone, Octaviano Batty, CMA, was present for exam.  Assessment/Plan: 1. Well woman exam with routine gynecological exam - Pap smear updated today - Mammogram will be done today - Colonoscopy 05/2020, follow up 5 years - Bone mineral density not due yet - lab work done with Dr. Martinique earlier this year - vaccines reviewed/updated  2. Amenorrhea - will check [place] next year prior to appt and may remove IUD at that time  3. Cervical cancer screening - Cytology - PAP( [place])  4. Postablative hypothyroidism - on levothyroixine  5. IUD (intrauterine device) in place  6.  OAB - referral to physical therapy

## 2021-11-12 ENCOUNTER — Ambulatory Visit: Payer: BC Managed Care – PPO | Attending: Obstetrics & Gynecology | Admitting: Physical Therapy

## 2021-11-12 ENCOUNTER — Encounter: Payer: Self-pay | Admitting: Physical Therapy

## 2021-11-12 DIAGNOSIS — N3281 Overactive bladder: Secondary | ICD-10-CM | POA: Diagnosis not present

## 2021-11-12 DIAGNOSIS — M6281 Muscle weakness (generalized): Secondary | ICD-10-CM | POA: Insufficient documentation

## 2021-11-12 DIAGNOSIS — R293 Abnormal posture: Secondary | ICD-10-CM | POA: Insufficient documentation

## 2021-11-12 DIAGNOSIS — R279 Unspecified lack of coordination: Secondary | ICD-10-CM | POA: Diagnosis not present
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
## 2021-11-13 LAB — CYTOLOGY - PAP
Comment: NEGATIVE
Diagnosis: NEGATIVE
High risk HPV: NEGATIVE

## 2021-12-23 ENCOUNTER — Ambulatory Visit: Payer: BC Managed Care – PPO | Admitting: Physical Therapy
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
## 2021-12-30 ENCOUNTER — Encounter: Payer: Self-pay | Admitting: Physical Therapy

## 2021-12-30 ENCOUNTER — Ambulatory Visit: Payer: BC Managed Care – PPO | Attending: Obstetrics & Gynecology | Admitting: Physical Therapy

## 2021-12-30 DIAGNOSIS — R293 Abnormal posture: Secondary | ICD-10-CM | POA: Insufficient documentation

## 2021-12-30 DIAGNOSIS — M6281 Muscle weakness (generalized): Secondary | ICD-10-CM | POA: Insufficient documentation

## 2021-12-30 DIAGNOSIS — R279 Unspecified lack of coordination: Secondary | ICD-10-CM | POA: Insufficient documentation
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
## 2022-01-07 ENCOUNTER — Encounter: Payer: Self-pay | Admitting: Physical Therapy

## 2022-01-07 ENCOUNTER — Ambulatory Visit: Payer: BC Managed Care – PPO | Admitting: Physical Therapy

## 2022-01-07 DIAGNOSIS — R293 Abnormal posture: Secondary | ICD-10-CM

## 2022-01-07 DIAGNOSIS — R279 Unspecified lack of coordination: Secondary | ICD-10-CM | POA: Diagnosis not present

## 2022-01-07 DIAGNOSIS — M6281 Muscle weakness (generalized): Secondary | ICD-10-CM
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
## 2022-01-14 ENCOUNTER — Ambulatory Visit: Payer: BC Managed Care – PPO | Admitting: Physical Therapy

## 2022-01-21 ENCOUNTER — Ambulatory Visit: Payer: BC Managed Care – PPO | Admitting: Physical Therapy

## 2022-01-22 ENCOUNTER — Ambulatory Visit: Payer: BC Managed Care – PPO | Attending: Obstetrics & Gynecology | Admitting: Physical Therapy

## 2022-01-22 ENCOUNTER — Encounter: Payer: Self-pay | Admitting: Physical Therapy

## 2022-01-22 DIAGNOSIS — R293 Abnormal posture: Secondary | ICD-10-CM | POA: Insufficient documentation

## 2022-01-22 DIAGNOSIS — R279 Unspecified lack of coordination: Secondary | ICD-10-CM | POA: Insufficient documentation

## 2022-01-22 DIAGNOSIS — M6281 Muscle weakness (generalized): Secondary | ICD-10-CM
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
## 2022-01-28 ENCOUNTER — Ambulatory Visit: Payer: BC Managed Care – PPO | Admitting: Physical Therapy

## 2022-01-28 DIAGNOSIS — R293 Abnormal posture: Secondary | ICD-10-CM

## 2022-01-28 DIAGNOSIS — R279 Unspecified lack of coordination: Secondary | ICD-10-CM

## 2022-01-28 DIAGNOSIS — M6281 Muscle weakness (generalized): Secondary | ICD-10-CM
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
## 2022-02-04 ENCOUNTER — Encounter: Payer: Self-pay | Admitting: Physical Therapy

## 2022-02-04 ENCOUNTER — Ambulatory Visit: Payer: BC Managed Care – PPO | Admitting: Physical Therapy

## 2022-02-04 DIAGNOSIS — R293 Abnormal posture: Secondary | ICD-10-CM

## 2022-02-04 DIAGNOSIS — M6281 Muscle weakness (generalized): Secondary | ICD-10-CM

## 2022-02-04 DIAGNOSIS — R279 Unspecified lack of coordination: Secondary | ICD-10-CM | POA: Diagnosis not present
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
## 2022-02-28 ENCOUNTER — Other Ambulatory Visit: Payer: Self-pay | Admitting: Family Medicine

## 2022-02-28 DIAGNOSIS — E890 Postprocedural hypothyroidism: Secondary | ICD-10-CM

## 2022-03-01 DIAGNOSIS — H524 Presbyopia: Secondary | ICD-10-CM | POA: Diagnosis not present

## 2022-03-09 DIAGNOSIS — H5213 Myopia, bilateral: Secondary | ICD-10-CM | POA: Diagnosis not present

## 2022-03-10 ENCOUNTER — Ambulatory Visit: Payer: BC Managed Care – PPO | Admitting: Physical Therapy
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
## 2022-03-21 ENCOUNTER — Encounter: Payer: BC Managed Care – PPO | Admitting: Physical Therapy

## 2022-03-31 ENCOUNTER — Encounter: Payer: BC Managed Care – PPO | Admitting: Physical Therapy

## 2022-04-07 ENCOUNTER — Encounter: Payer: BC Managed Care – PPO | Admitting: Physical Therapy

## 2022-04-28 NOTE — Progress Notes (Deleted)
HPI: [name] is a 57 y.o. female, who is here today for her routine physical.  Last CPE: 05/01/21  Regular exercise 3 or more time per week: *** Following a healthy diet: ***  Chronic medical problems: ***  Immunization History  Administered Date(s) Administered   Td 01/14/2004   Health Maintenance  Topic Date Due   COVID-19 Vaccine (1) Never done   DTaP/Tdap/Td (2 - Tdap) 01/13/2014   Zoster Vaccines- Shingrix (1 of 2) Never done   HIV Screening  05/01/2026 (Originally 11/26/1980)   INFLUENZA VACCINE  08/14/2022   MAMMOGRAM  11/12/2023   PAP SMEAR-Modifier  11/12/2026   COLONOSCOPY (Pts 45-62yrs Insurance coverage will need to be confirmed)  05/24/2030   Hepatitis C Screening  Completed   HPV VACCINES  Aged Out    She has *** concerns today.  Review of Systems  Current Outpatient Medications on File Prior to Visit  Medication Sig Dispense Refill   levothyroxine (SYNTHROID) 112 MCG tablet TAKE 1 TABLET BY MOUTH EVERY DAY 90 tablet 1   No current facility-administered medications on file prior to visit.    Past Medical History:  Diagnosis Date   ANEMIA-NOS    GERD    IBS (irritable bowel syndrome)    Lumbar herniated disc    not a problem now   Other postablative hypothyroidism 07/2009   I-131ablation 07/2009 for hyperthyroid   Rosacea     Past Surgical History:  Procedure Laterality Date   EUS N/A 05/04/2015   Procedure: UPPER ENDOSCOPIC ULTRASOUND (EUS) LINEAR;  Surgeon: Jeani Hawking, MD;  Location: [facility];  Service: Endoscopy;  Laterality: N/A;   EUS N/A 06/15/2015   Procedure: UPPER ENDOSCOPIC ULTRASOUND (EUS) LINEAR;  Surgeon: Jeani Hawking, MD;  Location: [facility];  Service: Endoscopy;  Laterality: N/A;   LAPAROSCOPIC GASTRIC RESECTION N/A 08/03/2015   Procedure: LAPAROSCOPIC GASTROTOMY WITH EXCISION OF  GASTIC TUMOR;  Surgeon: Avel Peace, MD;  Location: [facility];  Service: General;  Laterality: N/A;   TONSILLECTOMY     TUBAL  LIGATION  1998    Allergies  Allergen Reactions   Penicillins Other (See Comments)    Childhood allergy--unknown reaction    Family History  Problem Relation Age of Onset   Mental illness Mother    Lung disease Mother        lung fibrosis   Breast cancer Maternal Aunt    Diabetes Father 15   Colon cancer Paternal Uncle    Dementia Maternal Grandmother    Breast cancer Cousin 1   Cancer Other        cancer of tongue   Multiple births Sister        3 sets of twins   Cancer Maternal Uncle        unsure type   Diabetes Paternal Grandmother     Social History   Socioeconomic History   Marital status: Married    Spouse name: Not on file   Number of children: 2   Years of education: Not on file   Highest education level: Associate degree: academic program  Occupational History   Not on file  Tobacco Use   Smoking status: Former    Years: 10    Types: Cigarettes   Smokeless tobacco: Never   Tobacco comments:    Married, lives with spouse since 2010. 2 grown kids in [place] in 2009 from [place] to learn english  Vaping Use   Vaping Use: Never used  Substance and Sexual  Activity   Alcohol use: Yes    Alcohol/week: 1.0 standard drink of alcohol    Types: 1 Glasses of wine per week    Comment: social   Drug use: No   Sexual activity: Yes    Partners: Male    Birth control/protection: I.U.D.    Comment: BTL  Other Topics Concern   Not on file  Social History Narrative   Not on file   Social Determinants of Health   Financial Resource Strain: Not on file  Food Insecurity: Not on file  Transportation Needs: Not on file  Physical Activity: Not on file  Stress: Not on file  Social Connections: Not on file    There were no vitals filed for this visit. There is no height or weight on file to calculate BMI.  Wt Readings from Last 3 Encounters:  11/11/21 158 lb 6.4 oz (71.8 kg)  05/01/21 148 lb 8 oz (67.4 kg)  04/30/20 172 lb (78 kg)    Physical  Exam  ASSESSMENT AND PLAN: [name] was here today annual physical examination.  No orders of the defined types were placed in this encounter.   There are no diagnoses linked to this encounter.  There are no diagnoses linked to this encounter.  No follow-ups on file.  Betty G. [place], MD  [facility]. [place] office.

## 2022-04-29 ENCOUNTER — Encounter: Payer: BC Managed Care – PPO | Admitting: Family Medicine

## 2022-04-29 DIAGNOSIS — D500 Iron deficiency anemia secondary to blood loss (chronic): Secondary | ICD-10-CM

## 2022-04-29 DIAGNOSIS — E538 Deficiency of other specified B group vitamins: Secondary | ICD-10-CM

## 2022-04-29 DIAGNOSIS — Z130 Encounter for screening for diseases of the blood and blood-forming organs and certain disorders involving the immune mechanism: Secondary | ICD-10-CM

## 2022-04-29 DIAGNOSIS — Z13220 Encounter for screening for lipoid disorders: Secondary | ICD-10-CM

## 2022-04-29 DIAGNOSIS — Z0000 Encounter for general adult medical examination without abnormal findings: Secondary | ICD-10-CM

## 2022-05-02 NOTE — Progress Notes (Unsigned)
HPI: [name] is a 57 y.o. female, who is here today for her routine physical.  Last CPE: 05/01/21  Regular exercise 3 or more time per week: *** Following a healthy diet: ***  Chronic medical problems: ***  Immunization History  Administered Date(s) Administered  . Td 01/14/2004   Health Maintenance  Topic Date Due  . COVID-19 Vaccine (1) Never done  . DTaP/Tdap/Td (2 - Tdap) 01/13/2014  . Zoster Vaccines- Shingrix (1 of 2) Never done  . HIV Screening  05/01/2026 (Originally 11/26/1980)  . INFLUENZA VACCINE  08/14/2022  . MAMMOGRAM  11/12/2023  . PAP SMEAR-Modifier  11/12/2026  . COLONOSCOPY (Pts 45-12yrs Insurance coverage will need to be confirmed)  05/24/2030  . Hepatitis C Screening  Completed  . HPV VACCINES  Aged Out    She has *** concerns today.  Review of Systems  Current Outpatient Medications on File Prior to Visit  Medication Sig Dispense Refill  . levothyroxine (SYNTHROID) 112 MCG tablet TAKE 1 TABLET BY MOUTH EVERY DAY 90 tablet 1   No current facility-administered medications on file prior to visit.    Past Medical History:  Diagnosis Date  . ANEMIA-NOS   . GERD   . IBS (irritable bowel syndrome)   . Lumbar herniated disc    not a problem now  . Other postablative hypothyroidism 07/2009   I-131ablation 07/2009 for hyperthyroid  . Rosacea     Past Surgical History:  Procedure Laterality Date  . EUS N/A 05/04/2015   Procedure: UPPER ENDOSCOPIC ULTRASOUND (EUS) LINEAR;  Surgeon: Jeani Hawking, MD;  [facility];  Service: Endoscopy;  Laterality: N/A;  . EUS N/A 06/15/2015   Procedure: UPPER ENDOSCOPIC ULTRASOUND (EUS) LINEAR;  Surgeon: Jeani Hawking, MD;  [facility];  Service: Endoscopy;  Laterality: N/A;  . LAPAROSCOPIC GASTRIC RESECTION N/A 08/03/2015   Procedure: LAPAROSCOPIC GASTROTOMY WITH EXCISION OF  GASTIC TUMOR;  Surgeon: Avel Peace, MD;  Location: [facility];  Service: General;  Laterality: N/A;  .  TONSILLECTOMY    . TUBAL LIGATION  1998    Allergies  Allergen Reactions  . Penicillins Other (See Comments)    Childhood allergy--unknown reaction    Family History  Problem Relation Age of Onset  . Mental illness Mother   . Lung disease Mother        lung fibrosis  . Breast cancer Maternal Aunt   . Diabetes Father 73  . Colon cancer Paternal Uncle   . Dementia Maternal Grandmother   . Breast cancer Cousin 38  . Cancer Other        cancer of tongue  . Multiple births Sister        3 sets of twins  . Cancer Maternal Uncle        unsure type  . Diabetes Paternal Grandmother     Social History   Socioeconomic History  . Marital status: Married    Spouse name: Not on file  . Number of children: 2  . Years of education: Not on file  . Highest education level: Associate degree: academic program  Occupational History  . Not on file  Tobacco Use  . Smoking status: Former    Years: 10    Types: Cigarettes  . Smokeless tobacco: Never  . Tobacco comments:    Married, lives with spouse since 2010. 2 grown kids in [place] in 2009 from [place] to learn english  Vaping Use  . Vaping Use: Never used  Substance and Sexual  Activity  . Alcohol use: Yes    Alcohol/week: 1.0 standard drink of alcohol    Types: 1 Glasses of wine per week    Comment: social  . Drug use: No  . Sexual activity: Yes    Partners: Male    Birth control/protection: I.U.D.    Comment: BTL  Other Topics Concern  . Not on file  Social History Narrative  . Not on file   Social Determinants of Health   Financial Resource Strain: Not on file  Food Insecurity: Not on file  Transportation Needs: Not on file  Physical Activity: Not on file  Stress: Not on file  Social Connections: Not on file    There were no vitals filed for this visit. There is no height or weight on file to calculate BMI.  Wt Readings from Last 3 Encounters:  11/11/21 158 lb 6.4 oz (71.8 kg)  05/01/21 148 lb 8 oz (67.4 kg)   04/30/20 172 lb (78 kg)    Physical Exam Vitals and nursing note reviewed.  Constitutional:      General: She is not in acute distress.    Appearance: She is well-developed.  HENT:     Head: Normocephalic and atraumatic.     Right Ear: Hearing, tympanic membrane, ear canal and external ear normal.     Left Ear: Hearing, tympanic membrane, ear canal and external ear normal.     Mouth/Throat:     Mouth: Mucous membranes are moist.     Pharynx: Oropharynx is clear. Uvula midline.  Eyes:     Extraocular Movements: Extraocular movements intact.     Conjunctiva/sclera: Conjunctivae normal.     Pupils: Pupils are equal, round, and reactive to light.  Neck:     Thyroid: No thyromegaly.     Trachea: No tracheal deviation.  Cardiovascular:     Rate and Rhythm: Normal rate and regular rhythm.     Pulses:          Dorsalis pedis pulses are 2+ on the right side and 2+ on the left side.       Posterior tibial pulses are 2+ on the right side and 2+ on the left side.     Heart sounds: No murmur heard. Pulmonary:     Effort: Pulmonary effort is normal. No respiratory distress.     Breath sounds: Normal breath sounds.  Abdominal:     Palpations: Abdomen is soft. There is no hepatomegaly or mass.     Tenderness: There is no abdominal tenderness.  Genitourinary:    Comments: Deferred to gyn. Musculoskeletal:     Comments: No major deformity or signs of synovitis appreciated.  Lymphadenopathy:     Cervical: No cervical adenopathy.     Upper Body:     Right upper body: No supraclavicular adenopathy.     Left upper body: No supraclavicular adenopathy.  Skin:    General: Skin is warm.     Findings: No erythema or rash.  Neurological:     General: No focal deficit present.     Mental Status: She is alert and oriented to person, place, and time.     Cranial Nerves: No cranial nerve deficit.     Coordination: Coordination normal.     Gait: Gait normal.     Deep Tendon Reflexes:     Reflex  Scores:      Bicep reflexes are 2+ on the right side and 2+ on the left side.      Patellar  reflexes are 2+ on the right side and 2+ on the left side. Psychiatric:     Comments: Well groomed, good eye contact.   ASSESSMENT AND PLAN: [name] was here today annual physical examination.  No orders of the defined types were placed in this encounter.   There are no diagnoses linked to this encounter.  There are no diagnoses linked to this encounter.  No follow-ups on file.  Clemence Stillings G. [place], MD  [facility]. [place] office.

## 2022-05-05 ENCOUNTER — Encounter: Payer: Self-pay | Admitting: Family Medicine

## 2022-05-05 ENCOUNTER — Ambulatory Visit (INDEPENDENT_AMBULATORY_CARE_PROVIDER_SITE_OTHER): Payer: BC Managed Care – PPO | Admitting: Family Medicine

## 2022-05-05 VITALS — BP 104/70 | HR 73 | Temp 98.0°F | Resp 12 | Ht 65.0 in | Wt 166.0 lb

## 2022-05-05 DIAGNOSIS — Z13228 Encounter for screening for other metabolic disorders: Secondary | ICD-10-CM

## 2022-05-05 DIAGNOSIS — R109 Unspecified abdominal pain: Secondary | ICD-10-CM | POA: Diagnosis not present

## 2022-05-05 DIAGNOSIS — M791 Myalgia, unspecified site: Secondary | ICD-10-CM | POA: Diagnosis not present

## 2022-05-05 DIAGNOSIS — Z23 Encounter for immunization: Secondary | ICD-10-CM | POA: Diagnosis not present

## 2022-05-05 DIAGNOSIS — E890 Postprocedural hypothyroidism: Secondary | ICD-10-CM | POA: Diagnosis not present

## 2022-05-05 DIAGNOSIS — Z0001 Encounter for general adult medical examination with abnormal findings: Secondary | ICD-10-CM | POA: Diagnosis not present

## 2022-05-05 DIAGNOSIS — Z1329 Encounter for screening for other suspected endocrine disorder: Secondary | ICD-10-CM

## 2022-05-05 DIAGNOSIS — Z130 Encounter for screening for diseases of the blood and blood-forming organs and certain disorders involving the immune mechanism: Secondary | ICD-10-CM

## 2022-05-05 DIAGNOSIS — Z13220 Encounter for screening for lipoid disorders: Secondary | ICD-10-CM | POA: Diagnosis not present

## 2022-05-05 DIAGNOSIS — E538 Deficiency of other specified B group vitamins: Secondary | ICD-10-CM | POA: Diagnosis not present

## 2022-05-05 DIAGNOSIS — E559 Vitamin D deficiency, unspecified: Secondary | ICD-10-CM

## 2022-05-05 DIAGNOSIS — D500 Iron deficiency anemia secondary to blood loss (chronic): Secondary | ICD-10-CM

## 2022-05-05 DIAGNOSIS — Z0000 Encounter for general adult medical examination without abnormal findings: Secondary | ICD-10-CM

## 2022-05-05 LAB — URINALYSIS, ROUTINE W REFLEX MICROSCOPIC
Bilirubin Urine: NEGATIVE
Hgb urine dipstick: NEGATIVE
Ketones, ur: NEGATIVE
Leukocytes,Ua: NEGATIVE
Nitrite: NEGATIVE
Specific Gravity, Urine: 1.015 (ref 1.000–1.030)
Total Protein, Urine: NEGATIVE
Urine Glucose: NEGATIVE
Urobilinogen, UA: 0.2 (ref 0.0–1.0)
pH: 8 (ref 5.0–8.0)

## 2022-05-05 LAB — LIPID PANEL
Cholesterol: 201 mg/dL — ABNORMAL HIGH (ref 0–200)
HDL: 68.2 mg/dL (ref >39.00)
LDL Cholesterol: 119 mg/dL — ABNORMAL HIGH (ref 0–99)
NonHDL: 133.2
Total CHOL/HDL Ratio: 3
Triglycerides: 69 mg/dL (ref 0.0–149.0)
VLDL: 13.8 mg/dL (ref 0.0–40.0)

## 2022-05-05 LAB — BASIC METABOLIC PANEL
BUN: 18 mg/dL (ref 6–23)
CO2: 27 mEq/L (ref 19–32)
Calcium: 9.3 mg/dL (ref 8.4–10.5)
Chloride: 103 mEq/L (ref 96–112)
Creatinine, Ser: 0.78 mg/dL (ref 0.40–1.20)
GFR: 84.3 mL/min (ref >60.00)
Glucose, Bld: 88 mg/dL (ref 70–99)
Potassium: 4.5 mEq/L (ref 3.5–5.1)
Sodium: 139 mEq/L (ref 135–145)

## 2022-05-05 LAB — CBC
HCT: 40.7 % (ref 36.0–46.0)
Hemoglobin: 13.6 g/dL (ref 12.0–15.0)
MCHC: 33.5 g/dL (ref 30.0–36.0)
MCV: 90.3 fl (ref 78.0–100.0)
Platelets: 273 10*3/uL (ref 150.0–400.0)
RBC: 4.5 Mil/uL (ref 3.87–5.11)
RDW: 13.1 % (ref 11.5–15.5)
WBC: 5.9 10*3/uL (ref 4.0–10.5)

## 2022-05-05 LAB — HEPATIC FUNCTION PANEL
ALT: 28 U/L (ref 0–35)
AST: 29 U/L (ref 0–37)
Albumin: 4.5 g/dL (ref 3.5–5.2)
Alkaline Phosphatase: 86 U/L (ref 39–117)
Bilirubin, Direct: 0.1 mg/dL (ref 0.0–0.3)
Total Bilirubin: 0.6 mg/dL (ref 0.2–1.2)
Total Protein: 7 g/dL (ref 6.0–8.3)

## 2022-05-05 LAB — TSH: TSH: 0.4 u[IU]/mL (ref 0.35–5.50)

## 2022-05-05 LAB — VITAMIN D 25 HYDROXY (VIT D DEFICIENCY, FRACTURES): VITD: 25.25 ng/mL — ABNORMAL LOW (ref 30.00–100.00)

## 2022-05-05 LAB — VITAMIN B12: Vitamin B-12: 215 pg/mL (ref 211–911)

## 2022-05-05 NOTE — Assessment & Plan Note (Signed)
We discussed the importance of regular physical activity and healthy diet for prevention of chronic illness and/or complications. Preventive guidelines reviewed. Vaccination: Tdap given today. She prefers to hold on shingrix. Ca++ and vit D supplementation to continue. Next CPE in a year.

## 2022-05-05 NOTE — Assessment & Plan Note (Signed)
Last TSH in normal range, 0.45 in 10/2021 after adjusting levothyroxine. Further recommendation will be given according to TSH result.

## 2022-05-05 NOTE — Patient Instructions (Addendum)
A few things to remember from today's visit:  Routine general medical examination at a health care facility  Screening for endocrine, metabolic and immunity disorder - Plan: Basic metabolic panel  Iron deficiency anemia due to chronic blood loss  Postablative hypothyroidism - Plan: TSH  B12 deficiency - Plan: Vitamin B12, CBC  Screening for lipid disorders - Plan: Lipid panel  Flank pain - Plan: Urinalysis, Routine w reflex microscopic  Myalgia - Plan: CBC, Hepatic function panel  Urine checked today to evaluate for blood. Pain in legs could be caused by varicose veins and in left upper abdomen could be muscular. Monitor for new symptoms.  If you need refills for medications you take chronically, please call your pharmacy. Do not use My Chart to request refills or for acute issues that need immediate attention. If you send a my chart message, it may take a few days to be addressed, specially if I am not in the office.  Please be sure medication list is accurate. If a new problem present, please set up appointment sooner than planned today.  Health Maintenance, Female Adopting a healthy lifestyle and getting preventive care are important in promoting health and wellness. Ask your health care provider about: The right schedule for you to have regular tests and exams. Things you can do on your own to prevent diseases and keep yourself healthy. What should I know about diet, weight, and exercise? Eat a healthy diet  Eat a diet that includes plenty of vegetables, fruits, low-fat dairy products, and lean protein. Do not eat a lot of foods that are high in solid fats, added sugars, or sodium. Maintain a healthy weight Body mass index (BMI) is used to identify weight problems. It estimates body fat based on height and weight. Your health care provider can help determine your BMI and help you achieve or maintain a healthy weight. Get regular exercise Get regular exercise. This is one  of the most important things you can do for your health. Most adults should: Exercise for at least 150 minutes each week. The exercise should increase your heart rate and make you sweat (moderate-intensity exercise). Do strengthening exercises at least twice a week. This is in addition to the moderate-intensity exercise. Spend less time sitting. Even light physical activity can be beneficial. Watch cholesterol and blood lipids Have your blood tested for lipids and cholesterol at 58 years of age, then have this test every 5 years. Have your cholesterol levels checked more often if: Your lipid or cholesterol levels are high. You are older than 58 years of age. You are at high risk for heart disease. What should I know about cancer screening? Depending on your health history and family history, you may need to have cancer screening at various ages. This may include screening for: Breast cancer. Cervical cancer. Colorectal cancer. Skin cancer. Lung cancer. What should I know about heart disease, diabetes, and high blood pressure? Blood pressure and heart disease High blood pressure causes heart disease and increases the risk of stroke. This is more likely to develop in people who have high blood pressure readings or are overweight. Have your blood pressure checked: Every 3-5 years if you are 84-49 years of age. Every year if you are 37 years old or older. Diabetes Have regular diabetes screenings. This checks your fasting blood sugar level. Have the screening done: Once every three years after age 34 if you are at a normal weight and have a low risk for diabetes. More often  and at a younger age if you are overweight or have a high risk for diabetes. What should I know about preventing infection? Hepatitis B If you have a higher risk for hepatitis B, you should be screened for this virus. Talk with your health care provider to find out if you are at risk for hepatitis B infection. Hepatitis  C Testing is recommended for: Everyone born from [birth year] through 1965. Anyone with known risk factors for hepatitis C. Sexually transmitted infections (STIs) Get screened for STIs, including gonorrhea and chlamydia, if: You are sexually active and are younger than 58 years of age. You are older than 58 years of age and your health care provider tells you that you are at risk for this type of infection. Your sexual activity has changed since you were last screened, and you are at increased risk for chlamydia or gonorrhea. Ask your health care provider if you are at risk. Ask your health care provider about whether you are at high risk for HIV. Your health care provider may recommend a prescription medicine to help prevent HIV infection. If you choose to take medicine to prevent HIV, you should first get tested for HIV. You should then be tested every 3 months for as long as you are taking the medicine. Pregnancy If you are about to stop having your period (premenopausal) and you may become pregnant, seek counseling before you get pregnant. Take 400 to 800 micrograms (mcg) of folic acid every day if you become pregnant. Ask for birth control (contraception) if you want to prevent pregnancy. Osteoporosis and menopause Osteoporosis is a disease in which the bones lose minerals and strength with aging. This can result in bone fractures. If you are 54 years old or older, or if you are at risk for osteoporosis and fractures, ask your health care provider if you should: Be screened for bone loss. Take a calcium or vitamin D supplement to lower your risk of fractures. Be given hormone replacement therapy (HRT) to treat symptoms of menopause. Follow these instructions at home: Alcohol use Do not drink alcohol if: Your health care provider tells you not to drink. You are pregnant, may be pregnant, or are planning to become pregnant. If you drink alcohol: Limit how much you have to: 0-1 drink a day. Know  how much alcohol is in your drink. In the [place] one drink equals one 12 oz bottle of beer (355 mL), one 5 oz glass of wine (148 mL), or one 1 oz glass of hard liquor (44 mL). Lifestyle Do not use any products that contain nicotine or tobacco. These products include cigarettes, chewing tobacco, and vaping devices, such as e-cigarettes. If you need help quitting, ask your health care provider. Do not use street drugs. Do not share needles. Ask your health care provider for help if you need support or information about quitting drugs. General instructions Schedule regular health, dental, and eye exams. Stay current with your vaccines. Tell your health care provider if: You often feel depressed. You have ever been abused or do not feel safe at home. Summary Adopting a healthy lifestyle and getting preventive care are important in promoting health and wellness. Follow your health care provider's instructions about healthy diet, exercising, and getting tested or screened for diseases. Follow your health care provider's instructions on monitoring your cholesterol and blood pressure. This information is not intended to replace advice given to you by your health care provider. Make sure you discuss any questions you have with  your health care provider. Document Revised: 05/21/2020 Document Reviewed: 05/21/2020 Elsevier Patient Education  [employer].

## 2022-05-05 NOTE — Assessment & Plan Note (Signed)
Continue daily B12 supplementation, same dose. Further recommendation will be given according to B12 results.

## 2022-05-09 ENCOUNTER — Ambulatory Visit (INDEPENDENT_AMBULATORY_CARE_PROVIDER_SITE_OTHER): Payer: BC Managed Care – PPO

## 2022-05-09 DIAGNOSIS — E538 Deficiency of other specified B group vitamins: Secondary | ICD-10-CM | POA: Diagnosis not present

## 2022-05-09 MED ORDER — CYANOCOBALAMIN 1000 MCG/ML IJ SOLN
1000.0000 ug | Freq: Once | INTRAMUSCULAR | Status: AC
Start: 2022-05-09 — End: 2022-05-09
  Administered 2022-05-09: 1000 ug via INTRAMUSCULAR

## 2022-05-09 NOTE — Progress Notes (Signed)
Per orders of Dr. Jordan, injection of Cyanocobalamin 1000 mcg given by Keiston Manley L Perrin Gens. Patient tolerated injection well.  

## 2022-05-15 ENCOUNTER — Encounter: Payer: Self-pay | Admitting: Family Medicine

## 2022-05-15 DIAGNOSIS — H900 Conductive hearing loss, bilateral: Secondary | ICD-10-CM | POA: Diagnosis not present

## 2022-05-16 MED ORDER — "SYRINGE 25G X 1"" 3 ML MISC": 1 refills | Status: AC

## 2022-05-16 MED ORDER — CYANOCOBALAMIN 1000 MCG/ML IJ SOLN: INTRAMUSCULAR | 3 refills | Status: DC

## 2022-05-28 DIAGNOSIS — M79675 Pain in left toe(s): Secondary | ICD-10-CM | POA: Diagnosis not present

## 2022-08-11 ENCOUNTER — Other Ambulatory Visit: Payer: Self-pay | Admitting: Family Medicine

## 2022-09-07 ENCOUNTER — Other Ambulatory Visit: Payer: Self-pay | Admitting: Family Medicine

## 2022-09-07 DIAGNOSIS — E890 Postprocedural hypothyroidism: Secondary | ICD-10-CM

## 2022-09-14 DIAGNOSIS — R319 Hematuria, unspecified: Secondary | ICD-10-CM | POA: Diagnosis not present

## 2022-09-14 DIAGNOSIS — N3289 Other specified disorders of bladder: Secondary | ICD-10-CM | POA: Diagnosis not present

## 2022-09-14 DIAGNOSIS — N390 Urinary tract infection, site not specified: Secondary | ICD-10-CM | POA: Diagnosis not present

## 2022-09-14 DIAGNOSIS — R300 Dysuria: Secondary | ICD-10-CM | POA: Diagnosis not present

## 2022-11-21 ENCOUNTER — Other Ambulatory Visit: Payer: Self-pay | Admitting: Medical Genetics

## 2022-11-21 DIAGNOSIS — Z006 Encounter for examination for normal comparison and control in clinical research program: Secondary | ICD-10-CM

## 2022-11-24 ENCOUNTER — Ambulatory Visit (HOSPITAL_BASED_OUTPATIENT_CLINIC_OR_DEPARTMENT_OTHER): Payer: BC Managed Care – PPO | Admitting: Obstetrics & Gynecology

## 2022-12-01 ENCOUNTER — Other Ambulatory Visit (HOSPITAL_BASED_OUTPATIENT_CLINIC_OR_DEPARTMENT_OTHER): Payer: BC Managed Care – PPO

## 2022-12-01 ENCOUNTER — Other Ambulatory Visit (HOSPITAL_BASED_OUTPATIENT_CLINIC_OR_DEPARTMENT_OTHER): Payer: Self-pay

## 2022-12-01 DIAGNOSIS — N912 Amenorrhea, unspecified: Secondary | ICD-10-CM

## 2022-12-01 LAB — FOLLICLE STIMULATING HORMONE: FSH: 92.2 m[IU]/mL

## 2022-12-08 ENCOUNTER — Ambulatory Visit (HOSPITAL_BASED_OUTPATIENT_CLINIC_OR_DEPARTMENT_OTHER): Payer: BC Managed Care – PPO | Admitting: Obstetrics & Gynecology

## 2022-12-19 ENCOUNTER — Other Ambulatory Visit (HOSPITAL_COMMUNITY)
Admission: RE | Admit: 2022-12-19 | Discharge: 2022-12-19 | Disposition: A | Discharge status: Home / Self Care | Payer: Self-pay | Source: Ambulatory Visit | Attending: Oncology | Admitting: Oncology

## 2022-12-19 DIAGNOSIS — Z006 Encounter for examination for normal comparison and control in clinical research program: Secondary | ICD-10-CM | POA: Insufficient documentation

## 2022-12-22 ENCOUNTER — Ambulatory Visit (HOSPITAL_BASED_OUTPATIENT_CLINIC_OR_DEPARTMENT_OTHER): Payer: BC Managed Care – PPO | Admitting: Obstetrics & Gynecology

## 2022-12-22 ENCOUNTER — Encounter (HOSPITAL_BASED_OUTPATIENT_CLINIC_OR_DEPARTMENT_OTHER): Payer: Self-pay | Admitting: Obstetrics & Gynecology

## 2022-12-22 VITALS — BP 116/68 | HR 61 | Ht 65.0 in | Wt 166.6 lb

## 2022-12-22 DIAGNOSIS — Z1231 Encounter for screening mammogram for malignant neoplasm of breast: Secondary | ICD-10-CM | POA: Diagnosis not present

## 2022-12-22 DIAGNOSIS — Z01419 Encounter for gynecological examination (general) (routine) without abnormal findings: Secondary | ICD-10-CM

## 2022-12-22 DIAGNOSIS — Z30432 Encounter for removal of intrauterine contraceptive device: Secondary | ICD-10-CM

## 2022-12-22 DIAGNOSIS — Z780 Asymptomatic menopausal state: Secondary | ICD-10-CM | POA: Diagnosis not present

## 2022-12-22 NOTE — Progress Notes (Unsigned)
57 y.o. [id number] Married Other or two or more races female here for annual exam.  Doing well.  Going to have IUD removed today.  Has been in for 8 years.  She is nervous about having this done.  Denies vaginal bleeding.  GSH done 11/18 was 92 so reassured pt new IUD not needed.  Living outside [place] area.  Husband has dementia.  [place] in the [place] to [place] closer to her children.    No LMP recorded. (Menstrual status: IUD).          Sexually active: No.  The current method of family planning is post menopausal status.     Health Maintenance: Pap:  10/2021 History of abnormal Pap:  no MMG:  10/2021 Colonoscopy:  2022, five year follow up BMD:   guidelines reviewed Screening Labs: 04/2022   reports that she has quit smoking. Her smoking use included cigarettes. She has never used smokeless tobacco. She reports current alcohol use of about 1.0 standard drink of alcohol per week. She reports that she does not use drugs.  Past Medical History:  Diagnosis Date   ANEMIA-NOS    GERD    IBS (irritable bowel syndrome)    Lumbar herniated disc    not a problem now   Other postablative hypothyroidism 07/2009   I-131ablation 07/2009 for hyperthyroid   Rosacea     Past Surgical History:  Procedure Laterality Date   EUS N/A 05/04/2015   Procedure: UPPER ENDOSCOPIC ULTRASOUND (EUS) LINEAR;  Surgeon: Jeani Hawking, MD;  [facility];  Service: Endoscopy;  Laterality: N/A;   EUS N/A 06/15/2015   Procedure: UPPER ENDOSCOPIC ULTRASOUND (EUS) LINEAR;  Surgeon: Jeani Hawking, MD;  Location: [facility];  Service: Endoscopy;  Laterality: N/A;   LAPAROSCOPIC GASTRIC RESECTION N/A 08/03/2015   Procedure: LAPAROSCOPIC GASTROTOMY WITH EXCISION OF  GASTIC TUMOR;  Surgeon: Avel Peace, MD;  Location: [facility];  Service: General;  Laterality: N/A;   TONSILLECTOMY     TUBAL LIGATION  1998    Current Outpatient Medications  Medication Sig Dispense Refill   levothyroxine (SYNTHROID) 112 MCG  tablet TAKE 1 TABLET BY MOUTH EVERY DAY 90 tablet 1   cyanocobalamin (VITAMIN B12) 1000 MCG/ML injection INJECT 1,000 MCG INTRAMUSCULARLY EVERY 4-6 WEEKS. (Patient not taking: Reported on 12/22/2022) 9 mL 1   Syringe/Needle, Disp, (SYRINGE 3CC/25GX1") 25G X 1" 3 ML MISC To use with B12. (Patient not taking: Reported on 12/22/2022) 50 each 1   No current facility-administered medications for this visit.    Family History  Problem Relation Age of Onset   Mental illness Mother    Lung disease Mother        lung fibrosis   Breast cancer Maternal Aunt    Diabetes Father 59   Colon cancer Paternal Uncle    Dementia Maternal Grandmother    Breast cancer Cousin 10   Cancer Other        cancer of tongue   Multiple births Sister        3 sets of twins   Cancer Maternal Uncle        unsure type   Diabetes Paternal Grandmother     ROS: Constitutional: negative Genitourinary:negative  Exam:   BP 116/68 (BP Location: Right Arm, Patient Position: Sitting, Cuff Size: Normal)   Pulse 61   Ht 5\' 5"  (1.651 m)   Wt 166 lb 9.6 oz (75.6 kg)   BMI 27.72 kg/m   Height: 5\' 5"  (165.1 cm)  General appearance: alert, cooperative and appears stated age Head: Normocephalic, without obvious abnormality, atraumatic Neck: no adenopathy, supple, symmetrical, trachea midline and thyroid normal to inspection and palpation Lungs: clear to auscultation bilaterally Breasts: normal appearance, no masses or tenderness Heart: regular rate and rhythm Abdomen: soft, non-tender; bowel sounds normal; no masses,  no organomegaly Extremities: extremities normal, atraumatic, no cyanosis or edema Skin: Skin color, texture, turgor normal. No rashes or lesions Lymph nodes: Cervical, supraclavicular, and axillary nodes normal. No abnormal inguinal nodes palpated Neurologic: Grossly normal   Pelvic: External genitalia:  no lesions              Urethra:  normal appearing urethra with no masses, tenderness or lesions               Bartholins and Skenes: normal                 Vagina: normal appearing vagina with normal color and no discharge, no lesions              Cervix: no lesions, IUD string noted.  Grasped with ringed forceps and IUD removed completely with one pull              Pap taken: No. Bimanual Exam:  Uterus:  normal size, contour, position, consistency, mobility, non-tender              Adnexa: normal adnexa and no mass, fullness, tenderness               Rectovaginal: declined               Anus:  no visible lesions, she declined rectal exam  Chaperone, Ina Homes, CMA, was present for exam.  Assessment/Plan: 1. Well woman exam with routine gynecological exam - Pap smear 10/2021 with neg HR HPV.  Not indicated today. - Mammogram 10/2021.  Order placed and called radiology to see if could be done today.  They will reach out to her to scheduled as she will be here through early January - Colonoscopy 2022, follow up 5 years - Bone mineral density guidelines reviewed - lab work done with PCP, Dr. [place]il[place] in April - vaccines reviewed/updated   2. Encounter for screening mammogram for malignant neoplasm of breast - MM 3D SCREENING MAMMOGRAM BILATERAL BREAST; Future  3. Encounter for IUD removal - IUD removal successful today.  IUD discarded.  4. Postmenopausal

## 2022-12-30 LAB — GENECONNECT MOLECULAR SCREEN: Genetic Analysis Overall Interpretation: NEGATIVE

## 2023-01-06 ENCOUNTER — Encounter (HOSPITAL_BASED_OUTPATIENT_CLINIC_OR_DEPARTMENT_OTHER): Payer: Self-pay | Admitting: Radiology

## 2023-01-06 ENCOUNTER — Ambulatory Visit (HOSPITAL_BASED_OUTPATIENT_CLINIC_OR_DEPARTMENT_OTHER)
Admission: RE | Admit: 2023-01-06 | Discharge: 2023-01-06 | Disposition: A | Discharge status: Home / Self Care | Payer: BC Managed Care – PPO | Source: Ambulatory Visit | Attending: Obstetrics & Gynecology | Admitting: Obstetrics & Gynecology

## 2023-01-06 DIAGNOSIS — Z1231 Encounter for screening mammogram for malignant neoplasm of breast: Secondary | ICD-10-CM | POA: Insufficient documentation

## 2023-03-31 ENCOUNTER — Other Ambulatory Visit: Payer: Self-pay | Admitting: Family Medicine

## 2023-03-31 DIAGNOSIS — E890 Postprocedural hypothyroidism: Secondary | ICD-10-CM

## 2023-07-14 DIAGNOSIS — Z1211 Encounter for screening for malignant neoplasm of colon: Secondary | ICD-10-CM | POA: Diagnosis not present

## 2023-07-14 DIAGNOSIS — Z1231 Encounter for screening mammogram for malignant neoplasm of breast: Secondary | ICD-10-CM | POA: Diagnosis not present

## 2023-07-14 DIAGNOSIS — Z0000 Encounter for general adult medical examination without abnormal findings: Secondary | ICD-10-CM | POA: Diagnosis not present

## 2023-10-10 ENCOUNTER — Other Ambulatory Visit: Payer: Self-pay | Admitting: Family Medicine

## 2023-10-10 DIAGNOSIS — E890 Postprocedural hypothyroidism: Secondary | ICD-10-CM
